# Patient Record
Sex: Male | Born: 1955
Health system: Southern US, Community
[De-identification: ages and names within clinical notes are randomized; demographics above are authoritative.]

## PROBLEM LIST (undated history)

## (undated) HISTORY — PX: ANKLE SURGERY: SHX546

---

## 2008-03-02 LAB — HM COLONOSCOPY: HM Colonoscopy: NORMAL

## 2011-08-01 ENCOUNTER — Emergency Department: Payer: Self-pay | Admitting: Internal Medicine

## 2014-11-02 LAB — LIPID PANEL
CHOLESTEROL: 213 mg/dL — AB (ref 0–200)
HDL: 49 mg/dL (ref 35–70)
LDL Cholesterol: 149 mg/dL
LDl/HDL Ratio: 3
Triglycerides: 74 mg/dL (ref 40–160)

## 2014-11-02 LAB — BASIC METABOLIC PANEL
BUN: 14 mg/dL (ref 4–21)
Creatinine: 1 mg/dL (ref 0.6–1.3)
GLUCOSE: 107 mg/dL
Potassium: 4.6 mmol/L (ref 3.4–5.3)
Sodium: 140 mmol/L (ref 137–147)

## 2014-11-02 LAB — CBC AND DIFFERENTIAL
HEMATOCRIT: 46 % (ref 41–53)
Hemoglobin: 16.4 g/dL (ref 13.5–17.5)
NEUTROS ABS: 2 /uL
Platelets: 226 10*3/uL (ref 150–399)
WBC: 4.7 10*3/mL

## 2014-11-02 LAB — HEPATIC FUNCTION PANEL
ALK PHOS: 66 U/L (ref 25–125)
ALT: 29 U/L (ref 10–40)
AST: 19 U/L (ref 14–40)
Bilirubin, Total: 0.7 mg/dL

## 2014-11-02 LAB — TSH: TSH: 2.78 u[IU]/mL (ref 0.41–5.90)

## 2014-11-30 ENCOUNTER — Emergency Department: Payer: Self-pay | Admitting: Emergency Medicine

## 2014-11-30 LAB — COMPREHENSIVE METABOLIC PANEL
ALBUMIN: 4.2 g/dL (ref 3.4–5.0)
ANION GAP: 4 — AB (ref 7–16)
Alkaline Phosphatase: 93 U/L
BUN: 13 mg/dL (ref 7–18)
Bilirubin,Total: 0.7 mg/dL (ref 0.2–1.0)
CALCIUM: 8.7 mg/dL (ref 8.5–10.1)
CHLORIDE: 103 mmol/L (ref 98–107)
CREATININE: 1.08 mg/dL (ref 0.60–1.30)
Co2: 31 mmol/L (ref 21–32)
EGFR (Non-African Amer.): >60
GLUCOSE: 118 mg/dL — AB (ref 65–99)
OSMOLALITY: 277 (ref 275–301)
Potassium: 4.3 mmol/L (ref 3.5–5.1)
SGOT(AST): 33 U/L (ref 15–37)
SGPT (ALT): 52 U/L
SODIUM: 138 mmol/L (ref 136–145)
TOTAL PROTEIN: 7.6 g/dL (ref 6.4–8.2)

## 2014-11-30 LAB — URINALYSIS, COMPLETE
BACTERIA: NONE SEEN
BLOOD: NEGATIVE
Bilirubin,UR: NEGATIVE
Glucose,UR: NEGATIVE mg/dL (ref 0–75)
Ketone: NEGATIVE
Leukocyte Esterase: NEGATIVE
Nitrite: NEGATIVE
Ph: 5 (ref 4.5–8.0)
Protein: NEGATIVE
RBC,UR: <1 /HPF (ref 0–5)
SPECIFIC GRAVITY: 1.016 (ref 1.003–1.030)
Squamous Epithelial: <1

## 2014-11-30 LAB — CBC WITH DIFFERENTIAL/PLATELET
BASOS PCT: 0.6 %
Basophil #: 0.1 10*3/uL (ref 0.0–0.1)
EOS ABS: 0.1 10*3/uL (ref 0.0–0.7)
EOS PCT: 1.2 %
HCT: 46.3 % (ref 40.0–52.0)
HGB: 15.8 g/dL (ref 13.0–18.0)
LYMPHS ABS: 1.3 10*3/uL (ref 1.0–3.6)
LYMPHS PCT: 12.9 %
MCH: 31.7 pg (ref 26.0–34.0)
MCHC: 34 g/dL (ref 32.0–36.0)
MCV: 93 fL (ref 80–100)
Monocyte #: 0.5 x10 3/mm (ref 0.2–1.0)
Monocyte %: 5.1 %
NEUTROS ABS: 7.9 10*3/uL — AB (ref 1.4–6.5)
Neutrophil %: 80.2 %
PLATELETS: 250 10*3/uL (ref 150–440)
RBC: 4.98 10*6/uL (ref 4.40–5.90)
RDW: 12.7 % (ref 11.5–14.5)
WBC: 9.8 10*3/uL (ref 3.8–10.6)

## 2014-11-30 LAB — LIPASE, BLOOD: Lipase: 128 U/L (ref 73–393)

## 2015-11-22 DIAGNOSIS — M1990 Unspecified osteoarthritis, unspecified site: Secondary | ICD-10-CM | POA: Insufficient documentation

## 2015-11-22 DIAGNOSIS — G44229 Chronic tension-type headache, not intractable: Secondary | ICD-10-CM | POA: Insufficient documentation

## 2015-11-22 DIAGNOSIS — K219 Gastro-esophageal reflux disease without esophagitis: Secondary | ICD-10-CM | POA: Insufficient documentation

## 2015-11-22 DIAGNOSIS — K8000 Calculus of gallbladder with acute cholecystitis without obstruction: Secondary | ICD-10-CM | POA: Insufficient documentation

## 2015-11-22 DIAGNOSIS — K8020 Calculus of gallbladder without cholecystitis without obstruction: Secondary | ICD-10-CM | POA: Insufficient documentation

## 2015-11-23 ENCOUNTER — Ambulatory Visit (INDEPENDENT_AMBULATORY_CARE_PROVIDER_SITE_OTHER): Payer: BLUE CROSS/BLUE SHIELD | Admitting: Family Medicine

## 2015-11-23 ENCOUNTER — Encounter: Payer: Self-pay | Admitting: Family Medicine

## 2015-11-23 VITALS — BP 110/72 | HR 58 | Temp 97.7°F | Resp 12 | Ht 69.75 in | Wt 198.0 lb

## 2015-11-23 DIAGNOSIS — Z125 Encounter for screening for malignant neoplasm of prostate: Secondary | ICD-10-CM | POA: Diagnosis not present

## 2015-11-23 DIAGNOSIS — Z1211 Encounter for screening for malignant neoplasm of colon: Secondary | ICD-10-CM | POA: Diagnosis not present

## 2015-11-23 DIAGNOSIS — Z23 Encounter for immunization: Secondary | ICD-10-CM | POA: Diagnosis not present

## 2015-11-23 DIAGNOSIS — Z0000 Encounter for general adult medical examination without abnormal findings: Secondary | ICD-10-CM

## 2015-11-23 LAB — POCT URINALYSIS DIPSTICK
BILIRUBIN UA: NEGATIVE
Blood, UA: NEGATIVE
Glucose, UA: NEGATIVE
KETONES UA: NEGATIVE
Leukocytes, UA: NEGATIVE
Nitrite, UA: NEGATIVE
PROTEIN UA: NEGATIVE
SPEC GRAV UA: 1.01
Urobilinogen, UA: NEGATIVE
pH, UA: 6

## 2015-11-23 LAB — IFOBT (OCCULT BLOOD): IFOBT: NEGATIVE

## 2015-11-23 NOTE — Progress Notes (Signed)
Patient ID: John Roy, male   DOB: 1956-04-15, 59 y.o.   MRN: [id number]  Visit Date: 11/23/2015  Today's Provider: [name], MD   Chief Complaint  Patient presents with  . Annual Exam   Subjective:  John Roy is a 59 y.o. male who presents today for health maintenance and complete physical. He feels well. 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 Immunization History  Administered Date(s) Administered  . Td 02/17/2004  . Tdap 03/22/2011  . Zoster 11/15/2014  Colonoscopy 03/02/08 normal.  Review of Systems  Constitutional: Negative.   Eyes: Negative.   Respiratory: Negative.   Cardiovascular: Negative.   Gastrointestinal: Negative.   Endocrine: Negative.   Genitourinary: Negative.   Musculoskeletal: Negative.   Skin: Negative.   Allergic/Immunologic: Negative.   Neurological: Positive for headaches.  Hematological: Negative.   Psychiatric/Behavioral: Negative.     Social History   Social History  . Marital Status: Married    Spouse Name: N/A  . Number of Children: N/A  . Years of Education: N/A   Occupational History  . Not on file.   Social History Main Topics  . Smoking status: Never Smoker   . Smokeless tobacco: Never Used  . Alcohol Use: Yes     Comment: ocassional/rare  . Drug Use: No  . Sexual Activity: Not on file   Other Topics Concern  . Not on file   Social History Narrative    Patient Active Problem List   Diagnosis Date Noted  . Cholelithiasis with acute cholecystitis without biliary obstruction 11/22/2015  . Calculus of gallbladder 11/22/2015  . Acid reflux 11/22/2015  . Chronic tension type headache 11/22/2015  . Osteoarthrosis 11/22/2015    Past Surgical History  Procedure Laterality Date  . Ankle surgery      His family history includes Diabetes in his maternal grandfather, sister, and sister; Heart disease in his mother; Kidney failure in his paternal grandfather; Lung cancer in his father.     Outpatient Prescriptions Prior to Visit  Medication Sig Dispense Refill  . ferrous sulfate (IRON SUPPLEMENT) 325 (65 FE) MG tablet Take by mouth.     No facility-administered medications prior to visit.    Patient Care Team: Jerrol Banana., MD as PCP - General (Family Medicine)     Objective:   Vitals:  Filed Vitals:   11/23/15 0942  BP: 110/72  Pulse: 58  Temp: 97.7 F (36.5 C)  Resp: 12  Height: 5' 9.75" (1.772 m)  Weight: 198 lb (89.812 kg)    Physical Exam  Constitutional: He is oriented to person, place, and time. He appears well-developed and well-nourished.  HENT:  Head: Normocephalic and atraumatic.  Right Ear: External ear normal.  Left Ear: External ear normal.  Nose: Nose normal.  Mouth/Throat: Oropharynx is clear and moist.  Eyes: Conjunctivae and EOM are normal. Pupils are equal, round, and reactive to light.  Neck: Neck supple.  Cardiovascular: Normal rate, regular rhythm, normal heart sounds and intact distal pulses.   Pulmonary/Chest: Effort normal and breath sounds normal.  Abdominal: Soft.  Genitourinary: Rectum normal, prostate normal and penis normal.  Musculoskeletal: Normal range of motion.  Neurological: He is alert and oriented to person, place, and time. He has normal reflexes. No cranial nerve deficit. He exhibits normal muscle tone. Coordination abnormal.  Skin: Skin is warm and dry.  Psychiatric: He has a normal mood and affect. His behavior is normal. Judgment and thought content normal.  Depression Screen PHQ 2/9 Scores 11/23/2015  PHQ - 2 Score 0    Health Maintenance  Normal CPE-/Zostavax at age 23. RTC 1 year.  I have done the exam and reviewed the above chart and it is accurate to the best of my knowledge.

## 2015-11-24 ENCOUNTER — Telehealth: Payer: Self-pay

## 2015-11-24 LAB — CBC WITH DIFFERENTIAL/PLATELET
BASOS ABS: 0 10*3/uL (ref 0.0–0.2)
BASOS: 1 %
EOS (ABSOLUTE): 0.2 10*3/uL (ref 0.0–0.4)
Eos: 4 %
Hematocrit: 46.3 % (ref 37.5–51.0)
Hemoglobin: 16.3 g/dL (ref 12.6–17.7)
IMMATURE GRANS (ABS): 0 10*3/uL (ref 0.0–0.1)
IMMATURE GRANULOCYTES: 0 %
LYMPHS: 32 %
Lymphocytes Absolute: 1.7 10*3/uL (ref 0.7–3.1)
MCH: 31.8 pg (ref 26.6–33.0)
MCHC: 35.2 g/dL (ref 31.5–35.7)
MCV: 90 fL (ref 79–97)
Monocytes Absolute: 0.5 10*3/uL (ref 0.1–0.9)
Monocytes: 10 %
NEUTROS PCT: 53 %
Neutrophils Absolute: 2.8 10*3/uL (ref 1.4–7.0)
PLATELETS: 235 10*3/uL (ref 150–379)
RBC: 5.12 x10E6/uL (ref 4.14–5.80)
RDW: 13.8 % (ref 12.3–15.4)
WBC: 5.2 10*3/uL (ref 3.4–10.8)

## 2015-11-24 LAB — TSH: TSH: 3.6 u[IU]/mL (ref 0.450–4.500)

## 2015-11-24 LAB — COMPREHENSIVE METABOLIC PANEL
ALT: 36 IU/L (ref 0–44)
AST: 19 IU/L (ref 0–40)
Albumin/Globulin Ratio: 1.9 (ref 1.1–2.5)
Albumin: 4.5 g/dL (ref 3.5–5.5)
Alkaline Phosphatase: 70 IU/L (ref 39–117)
BUN/Creatinine Ratio: 15 (ref 9–20)
BUN: 16 mg/dL (ref 6–24)
Bilirubin Total: 0.7 mg/dL (ref 0.0–1.2)
CALCIUM: 9.5 mg/dL (ref 8.7–10.2)
CO2: 25 mmol/L (ref 18–29)
CREATININE: 1.04 mg/dL (ref 0.76–1.27)
Chloride: 100 mmol/L (ref 96–106)
GFR, EST AFRICAN AMERICAN: 91 mL/min/{1.73_m2} (ref >59)
GFR, EST NON AFRICAN AMERICAN: 79 mL/min/{1.73_m2} (ref >59)
GLUCOSE: 111 mg/dL — AB (ref 65–99)
Globulin, Total: 2.4 g/dL (ref 1.5–4.5)
Potassium: 5 mmol/L (ref 3.5–5.2)
Sodium: 139 mmol/L (ref 134–144)
TOTAL PROTEIN: 6.9 g/dL (ref 6.0–8.5)

## 2015-11-24 LAB — LIPID PANEL WITH LDL/HDL RATIO
Cholesterol, Total: 215 mg/dL — ABNORMAL HIGH (ref 100–199)
HDL: 47 mg/dL (ref >39)
LDL Calculated: 151 mg/dL — ABNORMAL HIGH (ref 0–99)
LDL/HDL RATIO: 3.2 ratio (ref 0.0–3.6)
Triglycerides: 87 mg/dL (ref 0–149)
VLDL CHOLESTEROL CAL: 17 mg/dL (ref 5–40)

## 2015-11-24 LAB — PSA: PROSTATE SPECIFIC AG, SERUM: 1.2 ng/mL (ref 0.0–4.0)

## 2015-11-24 NOTE — Telephone Encounter (Signed)
Called no answer and unable to leave a message-aa

## 2015-11-24 NOTE — Telephone Encounter (Signed)
-----   Message from Jerrol Banana., MD sent at 11/24/2015  7:54 AM EST ----- Labs stable. Diet and exercise as discussed for prediabetes and moderately high lipids.

## 2015-11-28 NOTE — Telephone Encounter (Signed)
lmtcb-aa 

## 2015-11-28 NOTE — Telephone Encounter (Signed)
Pt advised-aa 

## 2016-05-13 IMAGING — CT CT ABD-PELV W/ CM
2 of 5 series · 16 of 46 positions shown, 18 images · IV contrast (omnipaque)
Comparison: Abdominal ultrasound performed 08/01/2011

CLINICAL DATA: Intermittent left lower quadrant abdominal pain for
3 years, with nausea and vomiting. Initial encounter.

EXAM:
CT ABDOMEN AND PELVIS WITH CONTRAST
TECHNIQUE: Multidetector CT imaging of the abdomen and pelvis was performed
using the standard protocol following bolus administration of
intravenous contrast.
CONTRAST:  100 mL of Omnipaque 300 IV contrast

[Series 2: routine abd pel with · axial · 0.78mm/px · z∈[-290,+134]mm · 13 of 96 slices shown, 15 images]
[im 6/96  soft-tissue]
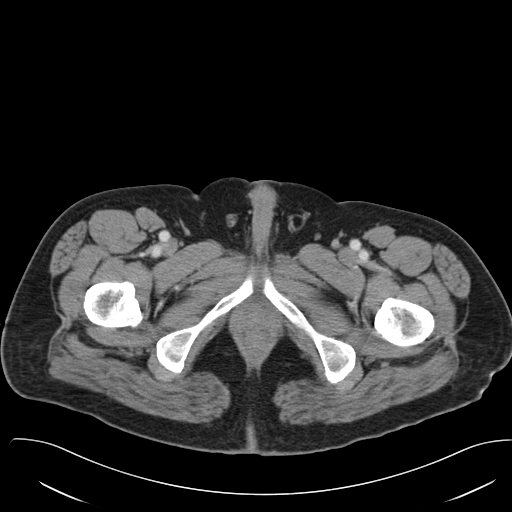
[im 6/96  bone]
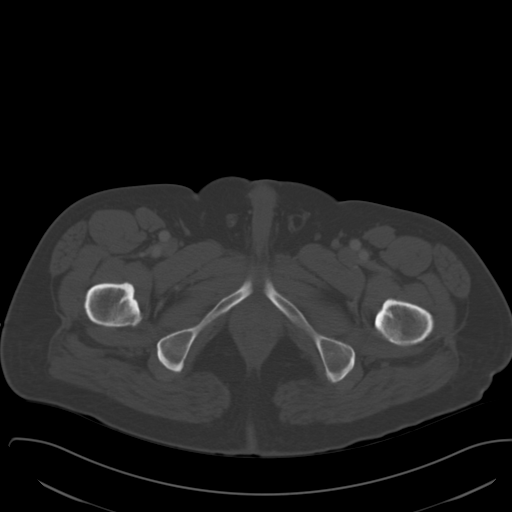
[im 16/96  soft-tissue]
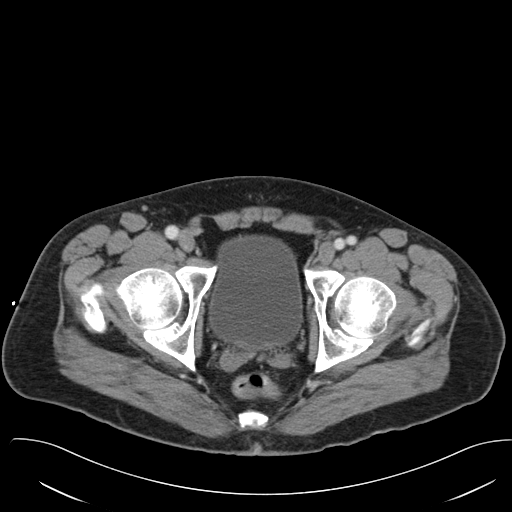
[im 21/96  soft-tissue]
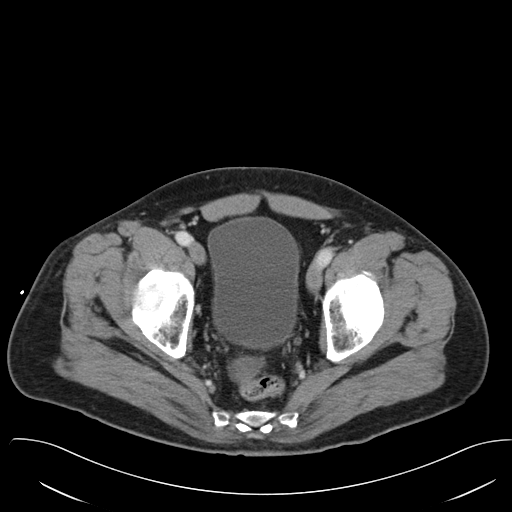
[im 26/96  soft-tissue]
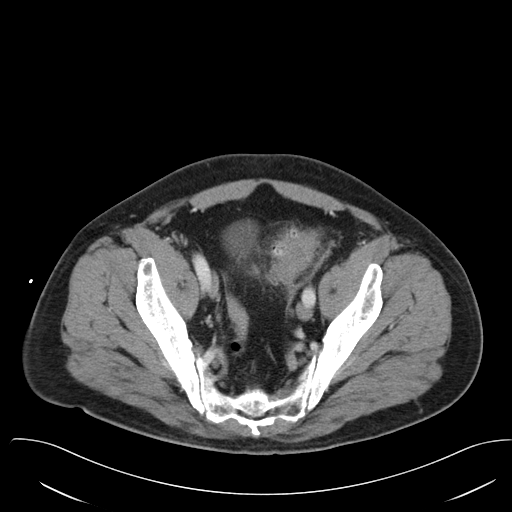
[im 36/96  soft-tissue]
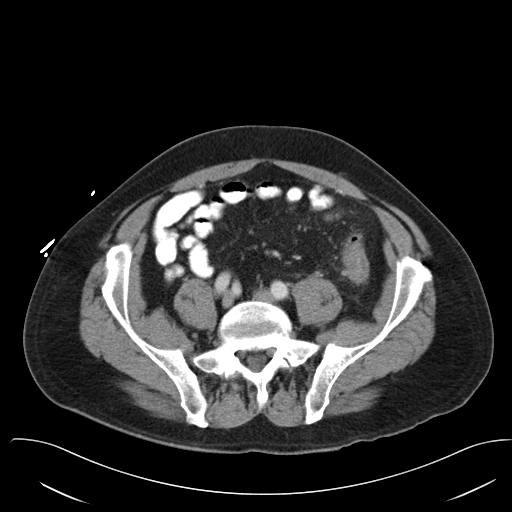
[im 41/96  soft-tissue]
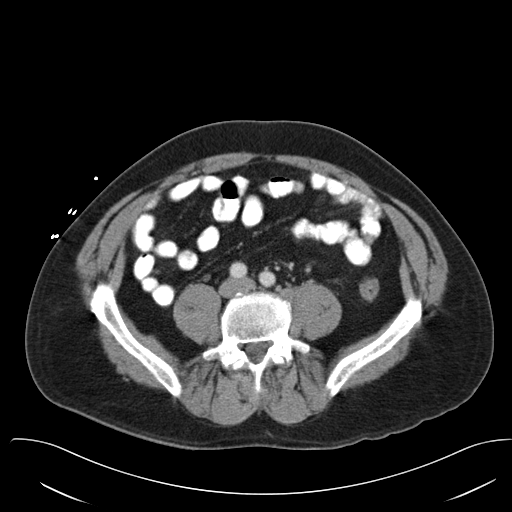
[im 51/96  soft-tissue]
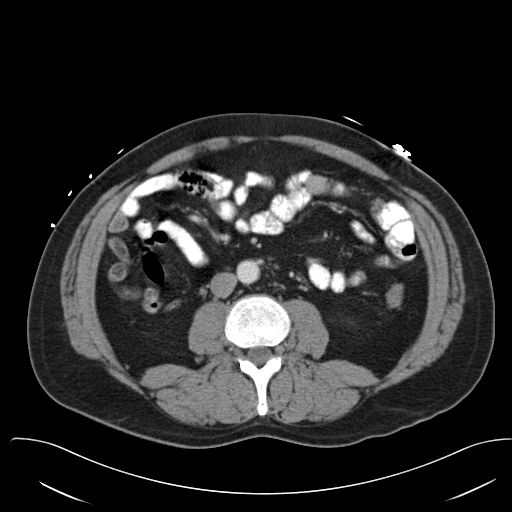
[im 56/96  soft-tissue]
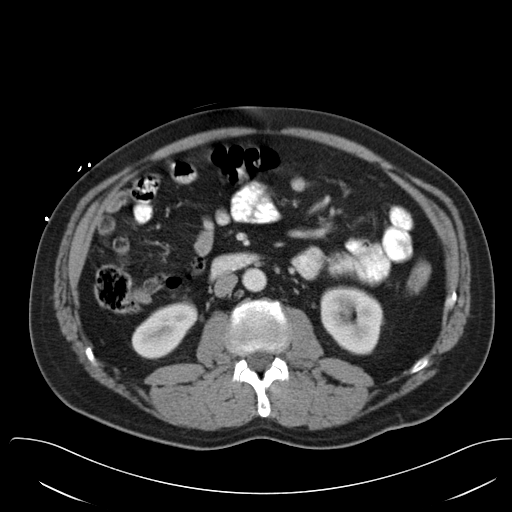
[im 61/96  soft-tissue]
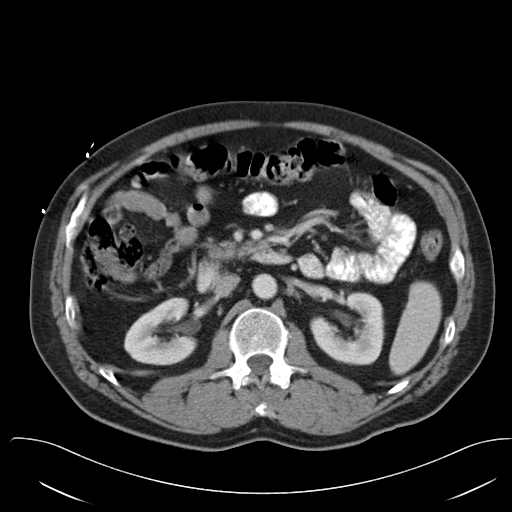
[im 61/96  bone]
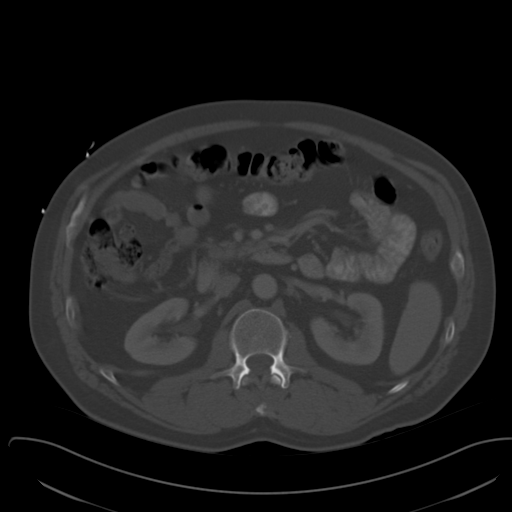
[im 71/96  soft-tissue]
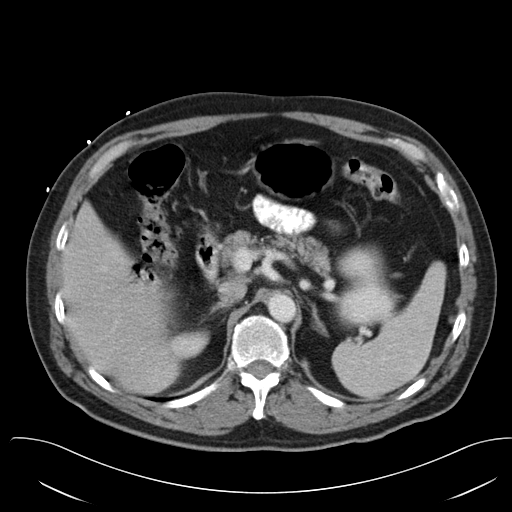
[im 76/96  soft-tissue]
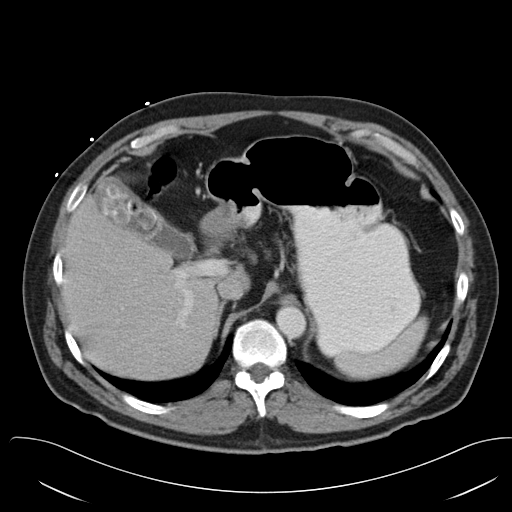
[im 81/96  soft-tissue]
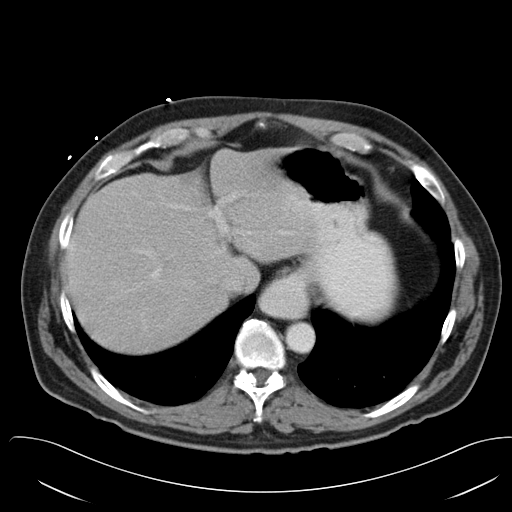
[im 91/96  soft-tissue]
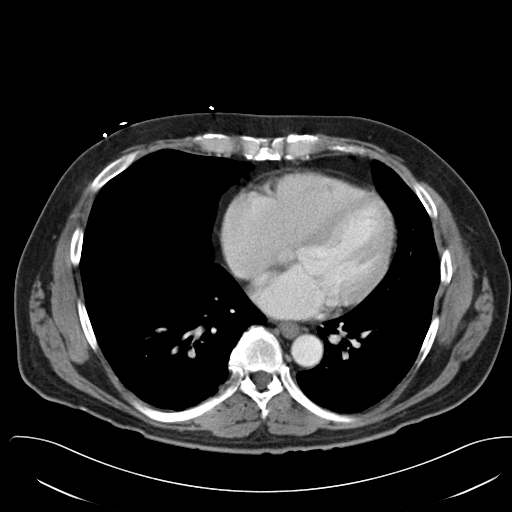

[Series 6: cor routine abd pel with · coronal · 0.81mm/px · 3 of 132 slices shown]
[im 44/132  soft-tissue]
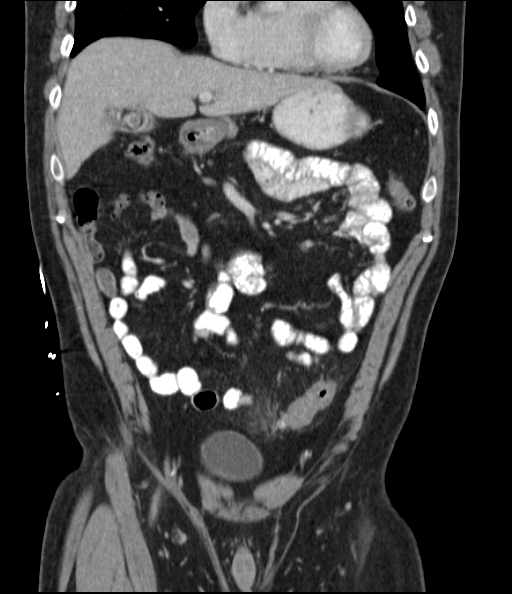
[im 59/132  soft-tissue]
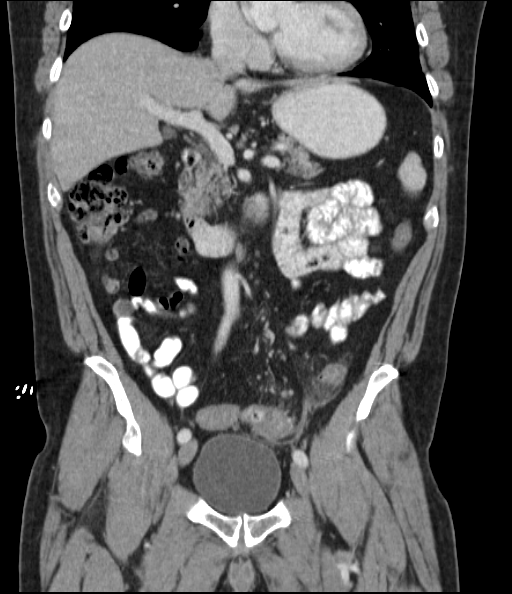
[im 73/132  soft-tissue]
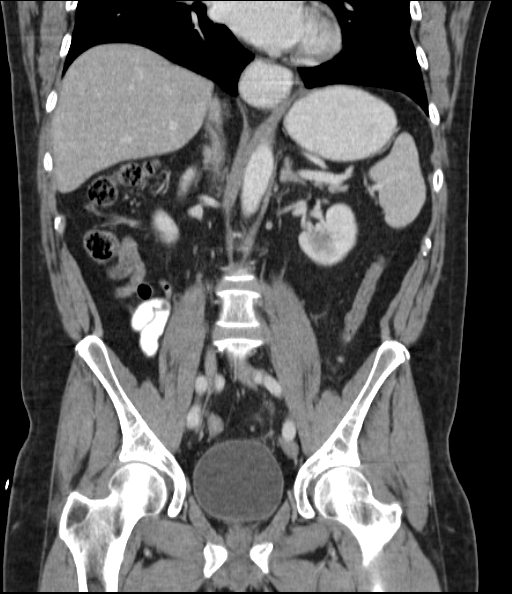

[16 of 46 positions shown; findings below may reference images not displayed]

FINDINGS: Minimal bibasilar atelectasis is noted. A small to moderate hiatal
hernia is seen, filled with contrast.

The liver and spleen are unremarkable in appearance. Stones are seen
filling the gallbladder. The pancreas and adrenal glands are
unremarkable.

The kidneys are unremarkable in appearance. There is no evidence of
hydronephrosis. No renal or ureteral stones are seen. No perinephric
stranding is appreciated. A tiny focus of increased attenuation at
the interpole region of the left kidney is thought to reflect
minimal contrast within a renal calyx.

The small bowel is unremarkable in appearance. The stomach is within
normal limits. No acute vascular abnormalities are seen.

The appendix is normal in caliber and contains air, but evidence for
appendicitis. Scattered diverticulosis is noted along the proximal
sigmoid colon. Focal associated soft tissue inflammation is seen,
with colonic wall thickening, compatible with acute diverticulitis.
Trace associated free fluid is noted, extending into the pelvis.
There is no evidence of perforation or abscess formation at this
time.

The bladder is mildly distended and grossly unremarkable. The
prostate remains normal in size, with minimal calcification. No
inguinal lymphadenopathy is seen.

No acute osseous abnormalities are identified.
IMPRESSION: 1. Acute diverticulitis noted, with focal soft tissue inflammation
and colonic wall thickening along the proximal sigmoid colon. Trace
associated free fluid seen. No evidence of perforation or abscess
formation at this time.
2. Cholelithiasis noted; gallbladder otherwise unremarkable.
3. Small to moderate hiatal hernia seen, filled with contrast.

## 2016-11-27 ENCOUNTER — Ambulatory Visit (INDEPENDENT_AMBULATORY_CARE_PROVIDER_SITE_OTHER): Payer: BLUE CROSS/BLUE SHIELD | Admitting: Family Medicine

## 2016-11-27 ENCOUNTER — Encounter: Payer: Self-pay | Admitting: Family Medicine

## 2016-11-27 VITALS — BP 112/76 | HR 62 | Temp 97.7°F | Resp 14 | Ht 69.75 in | Wt 188.0 lb

## 2016-11-27 DIAGNOSIS — Z719 Counseling, unspecified: Secondary | ICD-10-CM

## 2016-11-27 DIAGNOSIS — Z0000 Encounter for general adult medical examination without abnormal findings: Secondary | ICD-10-CM | POA: Diagnosis not present

## 2016-11-27 DIAGNOSIS — L570 Actinic keratosis: Secondary | ICD-10-CM | POA: Diagnosis not present

## 2016-11-27 DIAGNOSIS — Z23 Encounter for immunization: Secondary | ICD-10-CM | POA: Diagnosis not present

## 2016-11-27 DIAGNOSIS — R739 Hyperglycemia, unspecified: Secondary | ICD-10-CM | POA: Diagnosis not present

## 2016-11-27 DIAGNOSIS — Z1211 Encounter for screening for malignant neoplasm of colon: Secondary | ICD-10-CM

## 2016-11-27 DIAGNOSIS — Z125 Encounter for screening for malignant neoplasm of prostate: Secondary | ICD-10-CM

## 2016-11-27 LAB — POCT URINALYSIS DIPSTICK
Bilirubin, UA: NEGATIVE
GLUCOSE UA: NEGATIVE
Ketones, UA: NEGATIVE
Leukocytes, UA: NEGATIVE
NITRITE UA: NEGATIVE
PH UA: 6
RBC UA: NEGATIVE
Spec Grav, UA: 1.015
UROBILINOGEN UA: 0.2

## 2016-11-27 LAB — IFOBT (OCCULT BLOOD): IFOBT: NEGATIVE

## 2016-11-27 NOTE — Progress Notes (Signed)
Patient: John Roy, Male    DOB: [birth year], 60 y.o.   MRN: [id number] Visit Date: 11/27/2016  Today's Provider: [name], MD   Chief Complaint  Patient presents with  . Annual Exam   Subjective:  John Roy is a 60 y.o. male who presents today for health maintenance and complete physical. He feels well. He reports exercising goes to [employer] 3 times a week for about 45 minutes to 1 hour. He reports he is sleeping well. Patient is married., He has twins, a boy Jeneen Rinks and the daughter Lovena Le. Jeneen Rinks is in graduate  school for public health. Lovena Le is a fifth grade teacher in [place]. Jeneen Rinks is recently married and Lovena Le has avery serious boyfriend. No grandchildren to this point. He is bothered by extra tissue under his chin which looks heavy in pictures as he gets older.  No Pain but is interested in plastic surgery for this issue. Immunization History  Administered Date(s) Administered  . Influenza,inj,Quad PF,36+ Mos 11/23/2015  . Td 02/17/2004  . Tdap 03/22/2011  . Zoster 11/15/2014   Last colonoscopy 03/02/08 normal  Review of Systems  Constitutional: Negative.   HENT: Negative.   Eyes: Negative.   Respiratory: Negative.   Cardiovascular: Negative.   Gastrointestinal: Negative.   Endocrine: Negative.   Genitourinary: Negative.   Musculoskeletal: Negative.   Skin: Negative.   Allergic/Immunologic: Negative.   Neurological: Negative.   Hematological: Negative.   Psychiatric/Behavioral: Negative.     Social History   Social History  . Marital status: Married    Spouse name: N/A  . Number of children: N/A  . Years of education: N/A   Occupational History  . Not on file.   Social History Main Topics  . Smoking status: Never Smoker  . Smokeless tobacco: Never Used  . Alcohol use Yes     Comment: ocassional/rare  . Drug use: No  . Sexual activity: Not on file   Other Topics Concern  . Not on file   Social History Narrative  . No narrative  on file    Patient Active Problem List   Diagnosis Date Noted  . Cholelithiasis with acute cholecystitis without biliary obstruction 11/22/2015  . Calculus of gallbladder 11/22/2015  . Acid reflux 11/22/2015  . Chronic tension type headache 11/22/2015  . Osteoarthrosis 11/22/2015    Past Surgical History:  Procedure Laterality Date  . ANKLE SURGERY      His family history includes Diabetes in his maternal grandfather, sister, and sister; Heart disease in his mother; Kidney failure in his paternal grandfather; Lung cancer in his father.     Outpatient Encounter Prescriptions as of 11/27/2016  Medication Sig  . Aspirin-Acetaminophen-Caffeine (EXCEDRIN PO) Take by mouth.   No facility-administered encounter medications on file as of 11/27/2016.     Patient Care Team: Jerrol Banana., MD as PCP - General (Family Medicine)      Objective:   Vitals:  Vitals:   11/27/16 0921  BP: 112/76  Pulse: 62  Resp: 14  Temp: 97.7 F (36.5 C)  Weight: 188 lb (85.3 kg)  Height: 5' 9.75" (1.772 m)    Physical Exam  Constitutional: He is oriented to person, place, and time. He appears well-developed and well-nourished.  HENT:  Head: Normocephalic and atraumatic.  Right Ear: External ear normal.  Left Ear: External ear normal.  Mouth/Throat: Oropharynx is clear and moist.  Eyes: Conjunctivae are normal. Pupils are equal, round, and reactive to light.  Neck: Normal  range of motion. Neck supple.  Cardiovascular: Normal rate, regular rhythm, normal heart sounds and intact distal pulses.   No murmur heard. Pulmonary/Chest: Effort normal and breath sounds normal. No respiratory distress. He has no wheezes.  Abdominal: Soft. There is no tenderness. There is no rebound.  Genitourinary: Rectum normal, prostate normal and penis normal. Rectal exam shows guaiac negative stool. No penile tenderness.  Musculoskeletal: He exhibits edema (trace). He exhibits no tenderness.  Neurological:  He is alert and oriented to person, place, and time. No cranial nerve deficit. Coordination normal.  Skin: Skin is warm and dry.  Probable SKs on the side of the face.  Psychiatric: He has a normal mood and affect. His behavior is normal. Judgment and thought content normal.     Depression Screen PHQ 2/9 Scores 11/23/2015  PHQ - 2 Score 0   Assessment & Plan:   1. Annual physical exam - CBC w/Diff/Platelet - Comprehensive metabolic panel - Lipid Panel With LDL/HDL Ratio - TSH - POCT urinalysis dipstick  2. Colon cancer screening - IFOBT POC (occult bld, rslt in office)  3. Need for influenza vaccination - Flu Vaccine QUAD 36+ mos PF IM (Fluarix & Fluzone Quad PF)  4. Prostate cancer screening - PSA  5. Hyperglycemia - HgB A1c  6. Encounter for consultation Consultation for plastic surgery possibly. - Ambulatory referral to ENT  7. AK (actinic keratosis) - Ambulatory referral to Dermatology 8.Roy chin Refer to plastic surgery. HPI, Exam and A&P transcribed under direction and in the presence of Miguel Aschoff, MD. I have done the exam and reviewed the chart and it is accurate to the best of my John. Development worker, community has been used and  any errors in dictation or transcription are unintentional. Miguel Aschoff M.D. [facility]

## 2016-11-28 ENCOUNTER — Telehealth: Payer: Self-pay

## 2016-11-28 LAB — LIPID PANEL WITH LDL/HDL RATIO
CHOLESTEROL TOTAL: 190 mg/dL (ref 100–199)
HDL: 50 mg/dL (ref >39)
LDL CALC: 127 mg/dL — AB (ref 0–99)
LDl/HDL Ratio: 2.5 ratio units (ref 0.0–3.6)
TRIGLYCERIDES: 65 mg/dL (ref 0–149)
VLDL Cholesterol Cal: 13 mg/dL (ref 5–40)

## 2016-11-28 LAB — COMPREHENSIVE METABOLIC PANEL
ALK PHOS: 72 IU/L (ref 39–117)
ALT: 18 IU/L (ref 0–44)
AST: 20 IU/L (ref 0–40)
Albumin/Globulin Ratio: 1.7 (ref 1.2–2.2)
Albumin: 4.6 g/dL (ref 3.5–5.5)
BILIRUBIN TOTAL: 0.7 mg/dL (ref 0.0–1.2)
BUN / CREAT RATIO: 14 (ref 9–20)
BUN: 14 mg/dL (ref 6–24)
CHLORIDE: 100 mmol/L (ref 96–106)
CO2: 27 mmol/L (ref 18–29)
CREATININE: 1.02 mg/dL (ref 0.76–1.27)
Calcium: 9.7 mg/dL (ref 8.7–10.2)
GFR calc Af Amer: 93 mL/min/{1.73_m2} (ref >59)
GFR calc non Af Amer: 80 mL/min/{1.73_m2} (ref >59)
Globulin, Total: 2.7 g/dL (ref 1.5–4.5)
Glucose: 112 mg/dL — ABNORMAL HIGH (ref 65–99)
Potassium: 4.7 mmol/L (ref 3.5–5.2)
SODIUM: 140 mmol/L (ref 134–144)
Total Protein: 7.3 g/dL (ref 6.0–8.5)

## 2016-11-28 LAB — CBC WITH DIFFERENTIAL/PLATELET
BASOS ABS: 0 10*3/uL (ref 0.0–0.2)
Basos: 0 %
EOS (ABSOLUTE): 0.1 10*3/uL (ref 0.0–0.4)
EOS: 2 %
HEMATOCRIT: 46.9 % (ref 37.5–51.0)
Hemoglobin: 16.6 g/dL (ref 13.0–17.7)
Immature Grans (Abs): 0 10*3/uL (ref 0.0–0.1)
Immature Granulocytes: 0 %
LYMPHS ABS: 1.5 10*3/uL (ref 0.7–3.1)
Lymphs: 31 %
MCH: 31.2 pg (ref 26.6–33.0)
MCHC: 35.4 g/dL (ref 31.5–35.7)
MCV: 88 fL (ref 79–97)
MONOS ABS: 0.4 10*3/uL (ref 0.1–0.9)
Monocytes: 9 %
NEUTROS ABS: 2.7 10*3/uL (ref 1.4–7.0)
Neutrophils: 58 %
Platelets: 217 10*3/uL (ref 150–379)
RBC: 5.32 x10E6/uL (ref 4.14–5.80)
RDW: 13.9 % (ref 12.3–15.4)
WBC: 4.7 10*3/uL (ref 3.4–10.8)

## 2016-11-28 LAB — HEMOGLOBIN A1C
ESTIMATED AVERAGE GLUCOSE: 105 mg/dL
HEMOGLOBIN A1C: 5.3 % (ref 4.8–5.6)

## 2016-11-28 LAB — PSA: PROSTATE SPECIFIC AG, SERUM: 1.5 ng/mL (ref 0.0–4.0)

## 2016-11-28 LAB — TSH: TSH: 3.75 u[IU]/mL (ref 0.450–4.500)

## 2016-11-28 NOTE — Telephone Encounter (Signed)
Patient advised as below. Patient verbalizes understanding and is in agreement with treatment plan.  

## 2016-11-28 NOTE — Telephone Encounter (Signed)
-----   Message from Jerrol Banana., MD sent at 11/28/2016  8:28 AM EST ----- Labs stable. Still prediabetic but cholesterol much better. Continue to work on diet and exercise as patient has been doing

## 2016-11-29 ENCOUNTER — Telehealth: Payer: Self-pay | Admitting: Family Medicine

## 2016-11-29 NOTE — Telephone Encounter (Signed)
I need more information to make ENT referral.There is no diagnosis in referral or note

## 2016-11-29 NOTE — Telephone Encounter (Signed)
Please review below Sarah-aa

## 2016-11-29 NOTE — Telephone Encounter (Signed)
Correct--Dr Nadeen Landau at [facility].

## 2016-11-29 NOTE — Telephone Encounter (Signed)
Dr Rosanna Randy said for consultation for possible plastic surgery for the chin part correct?-aa

## 2018-01-15 ENCOUNTER — Encounter: Payer: BLUE CROSS/BLUE SHIELD | Admitting: Family Medicine

## 2018-01-28 ENCOUNTER — Ambulatory Visit (INDEPENDENT_AMBULATORY_CARE_PROVIDER_SITE_OTHER): Payer: BLUE CROSS/BLUE SHIELD | Admitting: Family Medicine

## 2018-01-28 ENCOUNTER — Encounter: Payer: Self-pay | Admitting: Family Medicine

## 2018-01-28 VITALS — BP 122/70 | HR 56 | Temp 97.9°F | Resp 16 | Ht 70.0 in | Wt 172.0 lb

## 2018-01-28 DIAGNOSIS — Z1211 Encounter for screening for malignant neoplasm of colon: Secondary | ICD-10-CM

## 2018-01-28 DIAGNOSIS — Z125 Encounter for screening for malignant neoplasm of prostate: Secondary | ICD-10-CM | POA: Diagnosis not present

## 2018-01-28 DIAGNOSIS — Z1159 Encounter for screening for other viral diseases: Secondary | ICD-10-CM | POA: Diagnosis not present

## 2018-01-28 DIAGNOSIS — Z0000 Encounter for general adult medical examination without abnormal findings: Secondary | ICD-10-CM | POA: Diagnosis not present

## 2018-01-28 LAB — POCT URINALYSIS DIPSTICK
Bilirubin, UA: NEGATIVE
Blood, UA: NEGATIVE
GLUCOSE UA: NEGATIVE
Ketones, UA: NEGATIVE
LEUKOCYTES UA: NEGATIVE
NITRITE UA: NEGATIVE
PROTEIN UA: NEGATIVE
Spec Grav, UA: 1.02 (ref 1.010–1.025)
Urobilinogen, UA: 0.2 E.U./dL
pH, UA: 6.5 (ref 5.0–8.0)

## 2018-01-28 NOTE — Progress Notes (Signed)
Patient: John Roy, Male    DOB: October 27, 1956, 62 y.o.   MRN: [id number] Visit Date: 01/28/2018  Today's Provider: [name], MD   Chief Complaint  Patient presents with  . Annual Exam   Subjective:    Annual physical exam John Roy is a 62 y.o. male who presents today for health maintenance and complete physical. He feels well. He reports exercising regulary. He reports he is sleeping well. Pt exercises several times per week--either spinning or running.  Colonoscopy- 03/02/2008. Normal. Tdap- 03/22/2011.      Review of Systems  Constitutional: Negative.   HENT: Negative.   Eyes: Negative.   Respiratory: Negative.   Cardiovascular: Negative.   Gastrointestinal: Positive for abdominal pain.       Intermittent abdominal discomfort which pt associates with GB disease or diverticulosis.Not associated with food. Pain not severe. No associated symptoms.  Endocrine: Negative.   Genitourinary: Negative.   Musculoskeletal: Negative.   Skin: Negative.   Allergic/Immunologic: Negative.   Neurological: Negative.   Hematological: Negative.   Psychiatric/Behavioral: Negative.     Social History      He  reports that  has never smoked. he has never used smokeless tobacco. He reports that he drinks alcohol. He reports that he does not use drugs.       Social History   Socioeconomic History  . Marital status: Married    Spouse name: None  . Number of children: None  . Years of education: None  . Highest education level: None  Social Needs  . Financial resource strain: None  . Food insecurity - worry: None  . Food insecurity - inability: None  . Transportation needs - medical: None  . Transportation needs - non-medical: None  Occupational History  . None  Tobacco Use  . Smoking status: Never Smoker  . Smokeless tobacco: Never Used  Substance and Sexual Activity  . Alcohol use: Yes    Comment: ocassional/rare  . Drug use: No  . Sexual activity:  None  Other Topics Concern  . None  Social History Narrative  . None    No past medical history on file.   Patient Active Problem List   Diagnosis Date Noted  . Cholelithiasis with acute cholecystitis without biliary obstruction 11/22/2015  . Calculus of gallbladder 11/22/2015  . Acid reflux 11/22/2015  . Chronic tension type headache 11/22/2015  . Osteoarthrosis 11/22/2015    Past Surgical History:  Procedure Laterality Date  . ANKLE SURGERY      Family History        Family Status  Relation Name Status  . Mother  Alive  . Father  Deceased at age 10  . Sister  Alive  . PGF  Deceased at age 32  . Sister  Alive  . MGF  (Not Specified)        His family history includes Diabetes in his maternal grandfather, sister, and sister; Heart disease in his mother; Kidney failure in his paternal grandfather; Lung cancer in his father.      No Known Allergies   Current Outpatient Medications:  .  Aspirin-Acetaminophen-Caffeine (EXCEDRIN PO), Take by mouth., Disp: , Rfl:    Patient Care Team: Jerrol Banana., MD as PCP - General (Family Medicine)      Objective:   Vitals: BP 122/70 (BP Location: Right Arm, Patient Position: Sitting, Cuff Size: Normal)   Pulse (!) 56   Temp 97.9 F (36.6 C)  Resp 16   Ht 5\' 10"  (1.778 m)   Wt 172 lb (78 kg)   BMI 24.68 kg/m    Vitals:   01/28/18 0906  BP: 122/70  Pulse: (!) 56  Resp: 16  Temp: 97.9 F (36.6 C)  Weight: 172 lb (78 kg)  Height: 5\' 10"  (1.778 m)     Physical Exam  Constitutional: He is oriented to person, place, and time. He appears well-developed and well-nourished.  HENT:  Head: Normocephalic and atraumatic.  Right Ear: External ear normal.  Left Ear: External ear normal.  Nose: Nose normal.  Mouth/Throat: Oropharynx is clear and moist.  Eyes: Conjunctivae are normal. No scleral icterus.  Neck: No thyromegaly present.  Cardiovascular: Normal rate, regular rhythm, normal heart sounds and intact  distal pulses.  Pulmonary/Chest: Effort normal and breath sounds normal.  Abdominal: Soft.  Genitourinary: Penis normal.  Musculoskeletal: Normal range of motion.  Neurological: He is alert and oriented to person, place, and time.  Skin: Skin is warm and dry.  Psychiatric: He has a normal mood and affect. His behavior is normal. Thought content normal.     Depression Screen PHQ 2/9 Scores 01/28/2018 11/23/2015  PHQ - 2 Score 0 0      Assessment & Plan:     Routine Health Maintenance and Physical Exam  Exercise Activities and Dietary recommendations Goals    None      Immunization History  Administered Date(s) Administered  . Influenza,inj,Quad PF,6+ Mos 11/23/2015, 11/27/2016  . Td 02/17/2004  . Tdap 03/22/2011  . Zoster 11/15/2014    Health Maintenance  Topic Date Due  . Hepatitis C Screening  11-13-1956  . HIV Screening  04/27/1972  . INFLUENZA VACCINE  03/10/2018 (Originally 07/10/2017)  . COLONOSCOPY  03/02/2018  . TETANUS/TDAP  03/21/2021    Time for f/u colonoscopy. Discussed health benefits of physical activity, and encouraged him to engage in regular exercise appropriate for his age and condition.  Intermittent abdominal pain Refer to GI.     I have done the exam and reviewed the above chart and it is accurate to the best of my knowledge. Development worker, community has been used in this note in any air is in the dictation or transcription are unintentional.  [name], MD  [place]

## 2018-01-29 LAB — CBC WITH DIFFERENTIAL/PLATELET
BASOS: 1 %
Basophils Absolute: 0 10*3/uL (ref 0.0–0.2)
EOS (ABSOLUTE): 0.1 10*3/uL (ref 0.0–0.4)
EOS: 2 %
HEMATOCRIT: 42.7 % (ref 37.5–51.0)
HEMOGLOBIN: 14.4 g/dL (ref 13.0–17.7)
Immature Grans (Abs): 0 10*3/uL (ref 0.0–0.1)
Immature Granulocytes: 0 %
LYMPHS ABS: 1.2 10*3/uL (ref 0.7–3.1)
Lymphs: 29 %
MCH: 31.4 pg (ref 26.6–33.0)
MCHC: 33.7 g/dL (ref 31.5–35.7)
MCV: 93 fL (ref 79–97)
MONOCYTES: 9 %
Monocytes Absolute: 0.4 10*3/uL (ref 0.1–0.9)
Neutrophils Absolute: 2.4 10*3/uL (ref 1.4–7.0)
Neutrophils: 59 %
Platelets: 185 10*3/uL (ref 150–379)
RBC: 4.58 x10E6/uL (ref 4.14–5.80)
RDW: 13.5 % (ref 12.3–15.4)
WBC: 4.1 10*3/uL (ref 3.4–10.8)

## 2018-01-29 LAB — COMPREHENSIVE METABOLIC PANEL
ALBUMIN: 4.1 g/dL (ref 3.6–4.8)
ALT: 25 IU/L (ref 0–44)
AST: 15 IU/L (ref 0–40)
Albumin/Globulin Ratio: 1.9 (ref 1.2–2.2)
Alkaline Phosphatase: 58 IU/L (ref 39–117)
BUN/Creatinine Ratio: 14 (ref 10–24)
BUN: 13 mg/dL (ref 8–27)
Bilirubin Total: 0.6 mg/dL (ref 0.0–1.2)
CO2: 25 mmol/L (ref 20–29)
CREATININE: 0.92 mg/dL (ref 0.76–1.27)
Calcium: 9.3 mg/dL (ref 8.6–10.2)
Chloride: 103 mmol/L (ref 96–106)
GFR calc Af Amer: 104 mL/min/{1.73_m2} (ref >59)
GFR, EST NON AFRICAN AMERICAN: 90 mL/min/{1.73_m2} (ref >59)
GLOBULIN, TOTAL: 2.2 g/dL (ref 1.5–4.5)
Glucose: 101 mg/dL — ABNORMAL HIGH (ref 65–99)
Potassium: 4.8 mmol/L (ref 3.5–5.2)
SODIUM: 139 mmol/L (ref 134–144)
Total Protein: 6.3 g/dL (ref 6.0–8.5)

## 2018-01-29 LAB — LIPID PANEL
CHOL/HDL RATIO: 3 ratio (ref 0.0–5.0)
Cholesterol, Total: 166 mg/dL (ref 100–199)
HDL: 55 mg/dL (ref >39)
LDL CALC: 100 mg/dL — AB (ref 0–99)
Triglycerides: 55 mg/dL (ref 0–149)
VLDL CHOLESTEROL CAL: 11 mg/dL (ref 5–40)

## 2018-01-29 LAB — PSA: PROSTATE SPECIFIC AG, SERUM: 1.1 ng/mL (ref 0.0–4.0)

## 2018-01-29 LAB — TSH: TSH: 3.24 u[IU]/mL (ref 0.450–4.500)

## 2018-01-29 LAB — HEPATITIS C ANTIBODY: Hep C Virus Ab: <0.1 s/co ratio (ref 0.0–0.9)

## 2018-01-31 ENCOUNTER — Telehealth: Payer: Self-pay

## 2018-01-31 NOTE — Telephone Encounter (Signed)
LMTCB  Thanks,  -Roslin Norwood 

## 2018-01-31 NOTE — Telephone Encounter (Signed)
-----   Message from Jerrol Banana., MD sent at 01/31/2018 12:45 PM EST ----- Labs ok

## 2018-02-04 NOTE — Telephone Encounter (Signed)
LMTCB ED 

## 2018-02-04 NOTE — Telephone Encounter (Signed)
Advised  ED 

## 2018-03-17 ENCOUNTER — Encounter: Payer: Self-pay | Admitting: Family Medicine

## 2018-03-20 ENCOUNTER — Encounter: Payer: Self-pay | Admitting: *Deleted

## 2018-03-27 DIAGNOSIS — Z1211 Encounter for screening for malignant neoplasm of colon: Secondary | ICD-10-CM | POA: Diagnosis not present

## 2018-03-27 DIAGNOSIS — D125 Benign neoplasm of sigmoid colon: Secondary | ICD-10-CM | POA: Diagnosis not present

## 2018-03-27 DIAGNOSIS — K5730 Diverticulosis of large intestine without perforation or abscess without bleeding: Secondary | ICD-10-CM | POA: Diagnosis not present

## 2018-03-27 DIAGNOSIS — K635 Polyp of colon: Secondary | ICD-10-CM | POA: Diagnosis not present

## 2018-11-22 ENCOUNTER — Ambulatory Visit: Payer: Self-pay | Admitting: Podiatry

## 2018-11-27 DIAGNOSIS — H11159 Pinguecula, unspecified eye: Secondary | ICD-10-CM | POA: Diagnosis not present

## 2018-12-12 ENCOUNTER — Ambulatory Visit: Payer: Self-pay | Admitting: Podiatry

## 2019-01-29 ENCOUNTER — Encounter: Payer: Self-pay | Admitting: Family Medicine

## 2019-03-12 ENCOUNTER — Encounter: Payer: Self-pay | Admitting: Family Medicine

## 2019-05-18 ENCOUNTER — Encounter: Payer: Self-pay | Admitting: Family Medicine

## 2019-08-03 ENCOUNTER — Other Ambulatory Visit: Payer: Self-pay

## 2019-08-03 ENCOUNTER — Ambulatory Visit (INDEPENDENT_AMBULATORY_CARE_PROVIDER_SITE_OTHER): Payer: BC Managed Care – PPO | Admitting: Family Medicine

## 2019-08-03 ENCOUNTER — Encounter: Payer: Self-pay | Admitting: Family Medicine

## 2019-08-03 VITALS — BP 122/70 | HR 53 | Temp 97.3°F | Resp 16 | Ht 70.0 in | Wt 179.0 lb

## 2019-08-03 DIAGNOSIS — Z0000 Encounter for general adult medical examination without abnormal findings: Secondary | ICD-10-CM | POA: Diagnosis not present

## 2019-08-03 DIAGNOSIS — Z125 Encounter for screening for malignant neoplasm of prostate: Secondary | ICD-10-CM | POA: Diagnosis not present

## 2019-08-03 NOTE — Progress Notes (Signed)
Patient: John Roy, Male    DOB: January 06, 1956, 63 y.o.   MRN: [id number] Visit Date: 08/03/2019  Today's Provider: [name], MD   Chief Complaint  Patient presents with  . Annual Exam   Subjective:     Annual physical exam John Roy is a 63 y.o. male who presents today for health maintenance and complete physical. He feels well. He reports exercising daily. He reports he is sleeping well.  Colonoscopy- 03/02/2008. Normal. Dr. Glennon Hamilton.  Tdap- 03/22/2011.   PSA-1.1  Review of Systems  Constitutional: Negative.   HENT: Negative.   Eyes: Negative.   Respiratory: Negative.   Cardiovascular: Negative.   Gastrointestinal: Negative.   Endocrine: Negative.   Genitourinary: Negative.   Musculoskeletal: Negative.   Skin: Negative.   Allergic/Immunologic: Negative.   Neurological: Negative.   Hematological: Negative.   Psychiatric/Behavioral: Negative.     Social History      He  reports that he has never smoked. He has never used smokeless tobacco. He reports current alcohol use. He reports that he does not use drugs.       Social History   Socioeconomic History  . Marital status: Married    Spouse name: Not on file  . Number of children: Not on file  . Years of education: Not on file  . Highest education level: Not on file  Occupational History  . Not on file  Social Needs  . Financial resource strain: Not on file  . Food insecurity    Worry: Not on file    Inability: Not on file  . Transportation needs    Medical: Not on file    Non-medical: Not on file  Tobacco Use  . Smoking status: Never Smoker  . Smokeless tobacco: Never Used  Substance and Sexual Activity  . Alcohol use: Yes    Comment: ocassional/rare  . Drug use: No  . Sexual activity: Not on file  Lifestyle  . Physical activity    Days per week: Not on file    Minutes per session: Not on file  . Stress: Not on file  Relationships  . Social Herbalist on phone: Not  on file    Gets together: Not on file    Attends religious service: Not on file    Active member of club or organization: Not on file    Attends meetings of clubs or organizations: Not on file    Relationship status: Not on file  Other Topics Concern  . Not on file  Social History Narrative  . Not on file    History reviewed. No pertinent past medical history.   Patient Active Problem List   Diagnosis Date Noted  . Cholelithiasis with acute cholecystitis without biliary obstruction 11/22/2015  . Calculus of gallbladder 11/22/2015  . Acid reflux 11/22/2015  . Chronic tension type headache 11/22/2015  . Osteoarthrosis 11/22/2015    Past Surgical History:  Procedure Laterality Date  . ANKLE SURGERY      Family History        Family Status  Relation Name Status  . Mother  Alive  . Father  Deceased at age 60  . Sister  Alive  . PGF  Deceased at age 42  . Sister  Alive  . MGF  (Not Specified)        His family history includes Diabetes in his maternal grandfather, sister, and sister; Heart disease in his mother; Kidney  failure in his paternal grandfather; Lung cancer in his father.      No Known Allergies   Current Outpatient Medications:  .  Aspirin-Acetaminophen-Caffeine (EXCEDRIN PO), Take by mouth., Disp: , Rfl:    Patient Care Team: Jerrol Banana., MD as PCP - General (Family Medicine)    Objective:    Vitals: BP 122/70 (BP Location: Left Arm, Patient Position: Sitting, Cuff Size: Normal)   Pulse (!) 53   Temp (!) 97.3 F (36.3 C) (Temporal)   Resp 16   Ht 5\' 10"  (1.778 m)   Wt 179 lb (81.2 kg)   SpO2 97%   BMI 25.68 kg/m    Vitals:   08/03/19 0851  BP: 122/70  Pulse: (!) 53  Resp: 16  Temp: (!) 97.3 F (36.3 C)  TempSrc: Temporal  SpO2: 97%  Weight: 179 lb (81.2 kg)  Height: 5\' 10"  (1.778 m)     Physical Exam Vitals signs reviewed.  Constitutional:      Appearance: He is well-developed.  HENT:     Head: Normocephalic and  atraumatic.     Right Ear: External ear normal.     Left Ear: External ear normal.     Nose: Nose normal.  Eyes:     General: No scleral icterus.    Conjunctiva/sclera: Conjunctivae normal.  Neck:     Thyroid: No thyromegaly.  Cardiovascular:     Rate and Rhythm: Normal rate and regular rhythm.     Heart sounds: Normal heart sounds.  Pulmonary:     Effort: Pulmonary effort is normal.     Breath sounds: Normal breath sounds.  Abdominal:     Palpations: Abdomen is soft.  Genitourinary:    Penis: Normal.   Musculoskeletal: Normal range of motion.  Skin:    General: Skin is warm and dry.  Neurological:     General: No focal deficit present.     Mental Status: He is alert and oriented to person, place, and time.  Psychiatric:        Mood and Affect: Mood normal.        Behavior: Behavior normal.        Thought Content: Thought content normal.        Judgment: Judgment normal.      Depression Screen PHQ 2/9 Scores 01/28/2018 11/23/2015  PHQ - 2 Score 0 0       Assessment & Plan:     Routine Health Maintenance and Physical Exam  Exercise Activities and Dietary recommendations Goals   None     Immunization History  Administered Date(s) Administered  . Influenza,inj,Quad PF,6+ Mos 11/23/2015, 11/27/2016  . Td 02/17/2004  . Tdap 03/22/2011  . Zoster 11/15/2014    Health Maintenance  Topic Date Due  . HIV Screening  04/27/1972  . COLONOSCOPY  03/02/2018  . INFLUENZA VACCINE  07/11/2019  . TETANUS/TDAP  03/21/2021  . Hepatitis C Screening  Completed     Discussed health benefits of physical activity, and encouraged him to engage in regular exercise appropriate for his age and condition. RTC 1 year. Colonoscopy in [place], MD  [facility]

## 2019-08-04 LAB — COMPREHENSIVE METABOLIC PANEL
ALT: 32 IU/L (ref 0–44)
AST: 22 IU/L (ref 0–40)
Albumin/Globulin Ratio: 2.2 (ref 1.2–2.2)
Albumin: 4.2 g/dL (ref 3.8–4.8)
Alkaline Phosphatase: 69 IU/L (ref 39–117)
BUN/Creatinine Ratio: 16 (ref 10–24)
BUN: 15 mg/dL (ref 8–27)
Bilirubin Total: 0.5 mg/dL (ref 0.0–1.2)
CO2: 24 mmol/L (ref 20–29)
Calcium: 9.1 mg/dL (ref 8.6–10.2)
Chloride: 102 mmol/L (ref 96–106)
Creatinine, Ser: 0.96 mg/dL (ref 0.76–1.27)
GFR calc Af Amer: 98 mL/min/{1.73_m2} (ref >59)
GFR calc non Af Amer: 84 mL/min/{1.73_m2} (ref >59)
Globulin, Total: 1.9 g/dL (ref 1.5–4.5)
Glucose: 105 mg/dL — ABNORMAL HIGH (ref 65–99)
Potassium: 4.4 mmol/L (ref 3.5–5.2)
Sodium: 139 mmol/L (ref 134–144)
Total Protein: 6.1 g/dL (ref 6.0–8.5)

## 2019-08-04 LAB — CBC WITH DIFFERENTIAL/PLATELET
Basophils Absolute: 0 10*3/uL (ref 0.0–0.2)
Basos: 1 %
EOS (ABSOLUTE): 0.1 10*3/uL (ref 0.0–0.4)
Eos: 3 %
Hematocrit: 39 % (ref 37.5–51.0)
Hemoglobin: 13.2 g/dL (ref 13.0–17.7)
Immature Grans (Abs): 0 10*3/uL (ref 0.0–0.1)
Immature Granulocytes: 0 %
Lymphocytes Absolute: 1.2 10*3/uL (ref 0.7–3.1)
Lymphs: 34 %
MCH: 30 pg (ref 26.6–33.0)
MCHC: 33.8 g/dL (ref 31.5–35.7)
MCV: 89 fL (ref 79–97)
Monocytes Absolute: 0.3 10*3/uL (ref 0.1–0.9)
Monocytes: 10 %
Neutrophils Absolute: 1.9 10*3/uL (ref 1.4–7.0)
Neutrophils: 52 %
Platelets: 231 10*3/uL (ref 150–450)
RBC: 4.4 x10E6/uL (ref 4.14–5.80)
RDW: 12.8 % (ref 11.6–15.4)
WBC: 3.6 10*3/uL (ref 3.4–10.8)

## 2019-08-04 LAB — LIPID PANEL WITH LDL/HDL RATIO
Cholesterol, Total: 162 mg/dL (ref 100–199)
HDL: 51 mg/dL (ref >39)
LDL Calculated: 100 mg/dL — ABNORMAL HIGH (ref 0–99)
LDl/HDL Ratio: 2 ratio (ref 0.0–3.6)
Triglycerides: 54 mg/dL (ref 0–149)
VLDL Cholesterol Cal: 11 mg/dL (ref 5–40)

## 2019-08-04 LAB — PSA: Prostate Specific Ag, Serum: 1.5 ng/mL (ref 0.0–4.0)

## 2019-08-04 LAB — TSH: TSH: 2.89 u[IU]/mL (ref 0.450–4.500)

## 2019-08-07 ENCOUNTER — Telehealth: Payer: Self-pay

## 2019-08-07 NOTE — Telephone Encounter (Signed)
Left message to call back  

## 2019-08-07 NOTE — Telephone Encounter (Signed)
Patient notified of lab results

## 2019-08-07 NOTE — Telephone Encounter (Signed)
-----   Message from Jerrol Banana., MD sent at 08/05/2019  3:58 PM EDT ----- Labs stable.

## 2019-08-12 ENCOUNTER — Other Ambulatory Visit: Payer: Self-pay

## 2019-08-12 DIAGNOSIS — Z20822 Contact with and (suspected) exposure to covid-19: Secondary | ICD-10-CM

## 2019-08-13 LAB — NOVEL CORONAVIRUS, NAA: SARS-CoV-2, NAA: NOT DETECTED

## 2019-08-31 ENCOUNTER — Other Ambulatory Visit: Payer: Self-pay

## 2019-08-31 DIAGNOSIS — R6889 Other general symptoms and signs: Secondary | ICD-10-CM | POA: Diagnosis not present

## 2019-08-31 DIAGNOSIS — Z20822 Contact with and (suspected) exposure to covid-19: Secondary | ICD-10-CM

## 2019-09-02 LAB — NOVEL CORONAVIRUS, NAA: SARS-CoV-2, NAA: NOT DETECTED

## 2020-08-02 NOTE — Progress Notes (Signed)
I,John Roy,acting as a scribe for John Durie, MD.,have documented all relevant documentation on the behalf of John Durie, MD,as directed by  John Durie, MD while in the presence of John Durie, MD.   Complete physical exam   Patient: John Roy   DOB: 12/21/1955   64 y.o. Male  MRN: [id number] Visit Date: 08/03/2020  Today's healthcare provider: [name], MD   Chief Complaint  Patient presents with  . Annual Exam   Subjective    John Roy is a 64 y.o. male who presents today for a complete physical exam.  He reports consuming a general diet. Home exercise routine includes walking. He generally feels fairly well. He reports sleeping fairly well. He does not have additional problems to discuss today.  Patient is married and is a father of the twins boy and girl.  His daughter just gave birth to twins. 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 HPI     History reviewed. No pertinent past medical history. Past Surgical History:  Procedure Laterality Date  . ANKLE SURGERY     Social History   Socioeconomic History  . Marital status: Married    Spouse name: Not on file  . Number of children: Not on file  . Years of education: Not on file  . Highest education level: Not on file  Occupational History  . Not on file  Tobacco Use  . Smoking status: Never Smoker  . Smokeless tobacco: Never Used  Substance and Sexual Activity  . Alcohol use: Yes    Comment: ocassional/rare  . Drug use: No  . Sexual activity: Not on file  Other Topics Concern  . Not on file  Social History Narrative  . Not on file   Social Determinants of Health   Financial Resource Strain:   . Difficulty of Paying Living Expenses: Not on file  Food Insecurity:   . Worried About Charity fundraiser in the Last Year: Not on file  . Ran Out of Food in the Last Year: Not on file  Transportation Needs:   . Lack  of Transportation (Medical): Not on file  . Lack of Transportation (Non-Medical): Not on file  Physical Activity:   . Days of Exercise per Week: Not on file  . Minutes of Exercise per Session: Not on file  Stress:   . Feeling of Stress : Not on file  Social Connections:   . Frequency of Communication with Friends and Family: Not on file  . Frequency of Social Gatherings with Friends and Family: Not on file  . Attends Religious Services: Not on file  . Active Member of Clubs or Organizations: Not on file  . Attends Archivist Meetings: Not on file  . Marital Status: Not on file  Intimate Partner Violence:   . Fear of Current or Ex-Partner: Not on file  . Emotionally Abused: Not on file  . Physically Abused: Not on file  . Sexually Abused: Not on file   Family Status  Relation Name Status  . Mother  Alive  . Father  Deceased at age 18  . Sister  Alive  . PGF  Deceased at age 76  . Sister  Alive  . MGF  (Not Specified)   Family History  Problem Relation Age of Onset  . Heart disease Mother   . Lung cancer Father   . Diabetes  Sister   . Kidney failure Paternal Grandfather   . Diabetes Sister   . Diabetes Maternal Grandfather    No Known Allergies  Patient Care Team: Jerrol Banana., MD as PCP - General (Family Medicine)   Medications: Outpatient Medications Prior to Visit  Medication Sig  . Aspirin-Acetaminophen-Caffeine (EXCEDRIN PO) Take by mouth.  . Ferrous Sulfate (IRON PO) daily.   . Zinc 50 MG CAPS once a week.    No facility-administered medications prior to visit.    Review of Systems  All other systems reviewed and are negative.     Objective    BP 124/77 (BP Location: Left Arm, Patient Position: Sitting, Cuff Size: Large)   Pulse (!) 56   Temp 98 F (36.7 C) (Oral)   Resp 16   Ht 5\' 10"  (1.778 m)   Wt 179 lb (81.2 kg)   SpO2 97%   BMI 25.68 kg/m    Physical Exam Vitals reviewed.  Constitutional:      Appearance: He is  well-developed.  HENT:     Head: Normocephalic and atraumatic.     Right Ear: External ear normal.     Left Ear: External ear normal.     Nose: Nose normal.  Eyes:     General: No scleral icterus.    Conjunctiva/sclera: Conjunctivae normal.  Neck:     Thyroid: No thyromegaly.  Cardiovascular:     Rate and Rhythm: Normal rate and regular rhythm.     Heart sounds: Normal heart sounds.  Pulmonary:     Effort: Pulmonary effort is normal.     Breath sounds: Normal breath sounds.  Abdominal:     Palpations: Abdomen is soft.  Genitourinary:    Penis: Normal.      Testes: Normal.     Prostate: Normal.     Rectum: Normal.  Skin:    General: Skin is warm and dry.     Comments: AKs.  Neurological:     General: No focal deficit present.     Mental Status: He is alert and oriented to person, place, and time.  Psychiatric:        Mood and Affect: Mood normal.        Behavior: Behavior normal.        Thought Content: Thought content normal.        Judgment: Judgment normal.      General Appearance:     Well developed, well nourished male. Alert, cooperative, in no acute distress, appears stated age  Head:    Normocephalic, without obvious abnormality, atraumatic  Eyes:    PERRL, conjunctiva/corneas clear, EOM's intact, fundi    benign, both eyes       Ears:    Normal TM's and external ear canals, both ears  Nose:   Nares normal, septum midline, mucosa normal, no drainage   or sinus tenderness  Throat:   Lips, mucosa, and tongue normal; teeth and gums normal  Neck:   Supple, symmetrical, trachea midline, no adenopathy;       thyroid:  No enlargement/tenderness/nodules; no carotid   bruit or JVD  Back:     Symmetric, no curvature, ROM normal, no CVA tenderness  Lungs:     Clear to auscultation bilaterally, respirations unlabored  Chest wall:    No tenderness or deformity  Heart:    Bradycardic. Normal rhythm. No murmurs, rubs, or gallops.  S1 and S2 normal  Abdomen:     Soft,  non-tender, bowel sounds active  all four quadrants,    no masses, no organomegaly  Genitalia:    normal, deferred  Rectal:    normal tone, normal prostate, no masses or tenderness  Extremities:   All extremities are intact. No cyanosis or edema  Pulses:   2+ and symmetric all extremities  Skin:   Skin color, texture, turgor normal, no rashes or lesions  Lymph nodes:   Cervical, supraclavicular, and axillary nodes normal  Neurologic:   CNII-XII intact. Normal strength, sensation and reflexes      throughout    Last depression screening scores PHQ 2/9 Scores 08/03/2020 01/28/2018 11/23/2015  PHQ - 2 Score 0 0 0  PHQ- 9 Score 1 - -   Last fall risk screening Fall Risk  01/28/2018  Falls in the past year? Yes  Number falls in past yr: 1  Comment fell off of his roof cleaning the gutters out about 4 months ago  Injury with Fall? Yes   Last Audit-C alcohol use screening Alcohol Use Disorder Test (AUDIT) 08/03/2020  1. How often do you have a drink containing alcohol? 2  2. How many drinks containing alcohol do you have on a typical day when you are drinking? 0  3. How often do you have six or more drinks on one occasion? 0  AUDIT-C Score 2  Alcohol Brief Interventions/Follow-up AUDIT Score <7 follow-up not indicated   A score of 3 or more in women, and 4 or more in men indicates increased risk for alcohol abuse, EXCEPT if all of the points are from question 1   Results for orders placed or performed in visit on 08/03/20  Urinalysis dipstick - POCT  Result Value Ref Range   Color, UA Yellow    Clarity, UA Clear    Glucose, UA Negative Negative   Bilirubin, UA Negative    Ketones, UA Negative    Spec Grav, UA 1.020 1.010 - 1.025   Blood, UA Negative    pH, UA 6.5 5.0 - 8.0   Protein, UA Negative Negative   Urobilinogen, UA 0.2 0.2 or 1.0 E.U./dL   Nitrite, UA Negative    Leukocytes, UA Negative Negative   Appearance Normal    Odor Normal   IFOBT POC (occult bld, rslt in office)   Result Value Ref Range   IFOBT Negative     Assessment & Plan    Routine Health Maintenance and Physical Exam  Exercise Activities and Dietary recommendations Goals   None     Immunization History  Administered Date(s) Administered  . Influenza,inj,Quad PF,6+ Mos 11/23/2015, 11/27/2016  . Td 02/17/2004  . Tdap 03/22/2011  . Zoster 11/15/2014    Health Maintenance  Topic Date Due  . COVID-19 Vaccine (1) Never done  . HIV Screening  Never done  . INFLUENZA VACCINE  07/10/2020  . TETANUS/TDAP  03/21/2021  . COLONOSCOPY  03/27/2028  . Hepatitis C Screening  Completed    Discussed health benefits of physical activity, and encouraged him to engage in regular exercise appropriate for his age and condition.  1. Annual physical exam Overall good health. - HIV Antibody (routine testing w rflx) - CBC - Comprehensive metabolic panel - Lipid panel - TSH - PSA - Urinalysis dipstick - POCT--Normal  2. Encounter for special screening examination for cardiovascular disorder  - Lipid panel  3. Encounter for screening for hematologic disorder  - CBC  4. Screening for hematuria or proteinuria  - Urinalysis dipstick - POCT--Normal  5. Encounter for screening for  HIV  - HIV Antibody (routine testing w rflx)  6. Screening for metabolic disorder  - Comprehensive metabolic panel  7. Prostate cancer screening  - PSA  8. Screening for thyroid disorder  - TSH  9. Actinic keratosis  - Ambulatory referral to Dermatology  10. Encounter for screening fecal occult blood testing  - IFOBT POC (occult bld, rslt in office)--Negative 11.  GERD Episodic.  May need GI referral.  Consider PPI but this seems to be diet and stress related.  Return in about 1 year (around 08/03/2021) for CPE.        Richmond Coldren Cranford Mon, MD  [facility] [phone] (phone) [phone] (fax)  [place]

## 2020-08-03 ENCOUNTER — Ambulatory Visit (INDEPENDENT_AMBULATORY_CARE_PROVIDER_SITE_OTHER): Payer: BLUE CROSS/BLUE SHIELD | Admitting: Family Medicine

## 2020-08-03 ENCOUNTER — Other Ambulatory Visit: Payer: Self-pay

## 2020-08-03 ENCOUNTER — Encounter: Payer: Self-pay | Admitting: Family Medicine

## 2020-08-03 VITALS — BP 124/77 | HR 56 | Temp 98.0°F | Resp 16 | Ht 70.0 in | Wt 179.0 lb

## 2020-08-03 DIAGNOSIS — Z13228 Encounter for screening for other metabolic disorders: Secondary | ICD-10-CM | POA: Diagnosis not present

## 2020-08-03 DIAGNOSIS — K219 Gastro-esophageal reflux disease without esophagitis: Secondary | ICD-10-CM

## 2020-08-03 DIAGNOSIS — Z136 Encounter for screening for cardiovascular disorders: Secondary | ICD-10-CM | POA: Diagnosis not present

## 2020-08-03 DIAGNOSIS — Z125 Encounter for screening for malignant neoplasm of prostate: Secondary | ICD-10-CM

## 2020-08-03 DIAGNOSIS — Z130 Encounter for screening for diseases of the blood and blood-forming organs and certain disorders involving the immune mechanism: Secondary | ICD-10-CM

## 2020-08-03 DIAGNOSIS — Z1329 Encounter for screening for other suspected endocrine disorder: Secondary | ICD-10-CM

## 2020-08-03 DIAGNOSIS — Z114 Encounter for screening for human immunodeficiency virus [HIV]: Secondary | ICD-10-CM | POA: Diagnosis not present

## 2020-08-03 DIAGNOSIS — Z0000 Encounter for general adult medical examination without abnormal findings: Secondary | ICD-10-CM | POA: Diagnosis not present

## 2020-08-03 DIAGNOSIS — L570 Actinic keratosis: Secondary | ICD-10-CM

## 2020-08-03 DIAGNOSIS — Z1211 Encounter for screening for malignant neoplasm of colon: Secondary | ICD-10-CM | POA: Diagnosis not present

## 2020-08-03 DIAGNOSIS — Z1389 Encounter for screening for other disorder: Secondary | ICD-10-CM | POA: Diagnosis not present

## 2020-08-03 LAB — POCT URINALYSIS DIPSTICK
Appearance: NORMAL
Bilirubin, UA: NEGATIVE
Blood, UA: NEGATIVE
Glucose, UA: NEGATIVE
Ketones, UA: NEGATIVE
Leukocytes, UA: NEGATIVE
Nitrite, UA: NEGATIVE
Odor: NORMAL
Protein, UA: NEGATIVE
Spec Grav, UA: 1.02 (ref 1.010–1.025)
Urobilinogen, UA: 0.2 E.U./dL
pH, UA: 6.5 (ref 5.0–8.0)

## 2020-08-03 LAB — IFOBT (OCCULT BLOOD): IFOBT: NEGATIVE

## 2020-08-04 ENCOUNTER — Telehealth: Payer: Self-pay

## 2020-08-04 LAB — LIPID PANEL
Chol/HDL Ratio: 3.6 ratio (ref 0.0–5.0)
Cholesterol, Total: 198 mg/dL (ref 100–199)
HDL: 55 mg/dL (ref >39)
LDL Chol Calc (NIH): 132 mg/dL — ABNORMAL HIGH (ref 0–99)
Triglycerides: 60 mg/dL (ref 0–149)
VLDL Cholesterol Cal: 11 mg/dL (ref 5–40)

## 2020-08-04 LAB — COMPREHENSIVE METABOLIC PANEL
ALT: 19 IU/L (ref 0–44)
AST: 18 IU/L (ref 0–40)
Albumin/Globulin Ratio: 1.9 (ref 1.2–2.2)
Albumin: 4.5 g/dL (ref 3.8–4.8)
Alkaline Phosphatase: 71 IU/L (ref 48–121)
BUN/Creatinine Ratio: 16 (ref 10–24)
BUN: 15 mg/dL (ref 8–27)
Bilirubin Total: 0.5 mg/dL (ref 0.0–1.2)
CO2: 24 mmol/L (ref 20–29)
Calcium: 9.2 mg/dL (ref 8.6–10.2)
Chloride: 103 mmol/L (ref 96–106)
Creatinine, Ser: 0.95 mg/dL (ref 0.76–1.27)
GFR calc Af Amer: 98 mL/min/{1.73_m2} (ref >59)
GFR calc non Af Amer: 85 mL/min/{1.73_m2} (ref >59)
Globulin, Total: 2.4 g/dL (ref 1.5–4.5)
Glucose: 110 mg/dL — ABNORMAL HIGH (ref 65–99)
Potassium: 4.7 mmol/L (ref 3.5–5.2)
Sodium: 139 mmol/L (ref 134–144)
Total Protein: 6.9 g/dL (ref 6.0–8.5)

## 2020-08-04 LAB — CBC
Hematocrit: 37.5 % (ref 37.5–51.0)
Hemoglobin: 11.5 g/dL — ABNORMAL LOW (ref 13.0–17.7)
MCH: 23.9 pg — ABNORMAL LOW (ref 26.6–33.0)
MCHC: 30.7 g/dL — ABNORMAL LOW (ref 31.5–35.7)
MCV: 78 fL — ABNORMAL LOW (ref 79–97)
Platelets: 238 10*3/uL (ref 150–450)
RBC: 4.82 x10E6/uL (ref 4.14–5.80)
RDW: 16.8 % — ABNORMAL HIGH (ref 11.6–15.4)
WBC: 4.1 10*3/uL (ref 3.4–10.8)

## 2020-08-04 LAB — PSA: Prostate Specific Ag, Serum: 2.1 ng/mL (ref 0.0–4.0)

## 2020-08-04 LAB — HIV ANTIBODY (ROUTINE TESTING W REFLEX): HIV Screen 4th Generation wRfx: NONREACTIVE

## 2020-08-04 LAB — TSH: TSH: 3.27 u[IU]/mL (ref 0.450–4.500)

## 2020-08-04 NOTE — Telephone Encounter (Signed)
-----   Message from Jerrol Banana., MD sent at 08/04/2020  1:03 PM EDT ----- Labs okay but blood count slightly down.  I like to see him back in a couple of months and get repeat CBC and iron panel.

## 2020-08-04 NOTE — Telephone Encounter (Signed)
Left message for patient to call back okay for [facility] triage to advise patient of lab results below. KW

## 2020-08-05 NOTE — Telephone Encounter (Signed)
Patient advised of lab results through mychart and has read the providers comments.  °

## 2020-11-28 ENCOUNTER — Other Ambulatory Visit: Payer: Self-pay

## 2020-11-28 ENCOUNTER — Ambulatory Visit: Payer: BLUE CROSS/BLUE SHIELD | Admitting: Dermatology

## 2020-11-28 DIAGNOSIS — D1800 Hemangioma unspecified site: Secondary | ICD-10-CM

## 2020-11-28 DIAGNOSIS — L853 Xerosis cutis: Secondary | ICD-10-CM

## 2020-11-28 DIAGNOSIS — L570 Actinic keratosis: Secondary | ICD-10-CM

## 2020-11-28 DIAGNOSIS — L821 Other seborrheic keratosis: Secondary | ICD-10-CM | POA: Diagnosis not present

## 2020-11-28 DIAGNOSIS — L578 Other skin changes due to chronic exposure to nonionizing radiation: Secondary | ICD-10-CM | POA: Diagnosis not present

## 2020-11-28 DIAGNOSIS — L820 Inflamed seborrheic keratosis: Secondary | ICD-10-CM | POA: Diagnosis not present

## 2020-11-28 NOTE — Progress Notes (Signed)
New Patient Visit  Subjective  John Roy is a 64 y.o. male who presents for the following: Check face and scalp  (For any new or changing skin lesions - patient has noticed lesions on the arms that he would like checked.).  The following portions of the chart were reviewed this encounter and updated as appropriate:   Tobacco  Allergies  Meds  Problems  Med Hx  Surg Hx  Fam Hx     Review of Systems:  No other skin or systemic complaints except as noted in HPI or Assessment and Plan.  Objective  Well appearing patient in no apparent distress; mood and affect are within normal limits.  A focused examination was performed including the face, scalp, arms and hands. Relevant physical exam findings are noted in the Assessment and Plan.  Objective  B/L arms x (5): Erythematous keratotic or waxy stuck-on papule or plaque.   Objective  Scalp and face x 16 (16): Erythematous thin papules/macules with gritty scale.   Assessment & Plan  Inflamed seborrheic keratosis (5) B/L arms x  Destruction of lesion - B/L arms x Complexity: simple   Destruction method: cryotherapy   Informed consent: discussed and consent obtained   Timeout:  patient name, date of birth, surgical site, and procedure verified Lesion destroyed using liquid nitrogen: Yes   Region frozen until ice ball extended beyond lesion: Yes   Outcome: patient tolerated procedure well with no complications   Post-procedure details: wound care instructions given    AK (actinic keratosis) (16) Scalp and face x 16  Destruction of lesion - Scalp and face x 16 Complexity: simple   Destruction method: cryotherapy   Informed consent: discussed and consent obtained   Timeout:  patient name, date of birth, surgical site, and procedure verified Lesion destroyed using liquid nitrogen: Yes   Region frozen until ice ball extended beyond lesion: Yes   Outcome: patient tolerated procedure well with no complications    Post-procedure details: wound care instructions given     Actinic Damage - Severe, chronic, secondary to cumulative UV radiation exposure over time - diffuse scaly erythematous macules and papules with underlying dyspigmentation - Discussed Prescription "Field Treatment" for Severe, Chronic Confluent Actinic Changes with Pre-Cancerous Actinic Keratoses Field treatment involves treatment of an entire area of skin that has confluent Actinic Changes (Sun/ Ultraviolet light damage) and PreCancerous Actinic Keratoses by method of PhotoDynamic Therapy (PDT) and/or prescription Topical Chemotherapy agents such as 5-fluorouracil, 5-fluorouracil/calcipotriene, and/or imiquimod.  The purpose is to decrease the number of clinically evident and subclinical PreCancerous lesions to prevent progression to development of skin cancer by chemically destroying early precancer changes that may or may not be visible.  It has been shown to reduce the risk of developing skin cancer in the treated area. As a result of treatment, redness, scaling, crusting, and open sores may occur during treatment course. One or more than one of these methods may be used and may have to be used several times to control, suppress and eliminate the PreCancerous changes. Discussed treatment course, expected reaction, and possible side effects. - Recommend daily broad spectrum sunscreen SPF 30+ to sun-exposed areas, reapply every 2 hours as needed.  - Call for new or changing lesions.  Seborrheic Keratoses - Stuck-on, waxy, tan-brown papules and plaques  - Discussed benign etiology and prognosis. - Observe - Call for any changes  Xerosis - diffuse xerotic patches - recommend gentle, hydrating skin care - gentle skin care handout given  Hemangiomas - Red papules - Discussed benign nature - Observe - Call for any changes  Return in about 4 months (around 03/29/2021) for AK recheck; in 4-8 weeks with nurse PDT of the scalp, 12 weeks  PDT face.  Luther Redo, CMA, am acting as scribe for Sarina Ser, MD .  Documentation: I have reviewed the above documentation for accuracy and completeness, and I agree with the above.  Sarina Ser, MD

## 2020-12-07 ENCOUNTER — Encounter: Payer: Self-pay | Admitting: Dermatology

## 2020-12-27 ENCOUNTER — Ambulatory Visit: Payer: BLUE CROSS/BLUE SHIELD

## 2021-03-28 ENCOUNTER — Ambulatory Visit: Payer: BLUE CROSS/BLUE SHIELD | Admitting: Dermatology

## 2021-03-28 ENCOUNTER — Other Ambulatory Visit: Payer: Self-pay

## 2021-03-28 DIAGNOSIS — L570 Actinic keratosis: Secondary | ICD-10-CM | POA: Diagnosis not present

## 2021-03-28 DIAGNOSIS — D692 Other nonthrombocytopenic purpura: Secondary | ICD-10-CM

## 2021-03-28 DIAGNOSIS — L578 Other skin changes due to chronic exposure to nonionizing radiation: Secondary | ICD-10-CM | POA: Diagnosis not present

## 2021-03-28 NOTE — Patient Instructions (Addendum)
If you have any questions or concerns for your doctor, please call our main line at [phone] and press option 4 to reach your doctor's medical assistant. If no one answers, please leave a voicemail as directed and we will return your call as soon as possible. Messages left after 4 pm will be answered the following business day.   You may also send us a message via MyChart. We typically respond to MyChart messages within 1-2 business days.  For prescription refills, please ask your pharmacy to contact our office. Our fax number is [phone].  If you have an urgent issue when the clinic is closed that cannot wait until the next business day, you can page your doctor at the number below.    Please note that while we do our best to be available for urgent issues outside of office hours, we are not available 24/7.   If you have an urgent issue and are unable to reach us, you may choose to seek medical care at your doctor's office, retail clinic, urgent care center, or emergency room.  If you have a medical emergency, please immediately call 911 or go to the emergency department.  Pager Numbers  - Dr. Kowalski: [phone]  - Dr. Moye: [phone]  - Dr. Stewart: [phone]  In the event of inclement weather, please call our main line at [phone] for an update on the status of any delays or closures.  Dermatology Medication Tips: Please keep the boxes that topical medications come in in order to help keep track of the instructions about where and how to use these. Pharmacies typically print the medication instructions only on the boxes and not directly on the medication tubes.   If your medication is too expensive, please contact our office at [phone] option 4 or send us a message through MyChart.   We are unable to tell what your co-pay for medications will be in advance as this is different depending on your insurance coverage. However, we may be able to find a substitute  medication at lower cost or fill out paperwork to get insurance to cover a needed medication.   If a prior authorization is required to get your medication covered by your insurance company, please allow us 1-2 business days to complete this process.  Drug prices often vary depending on where the prescription is filled and some pharmacies may offer cheaper prices.  The website www.goodrx.com contains coupons for medications through different pharmacies. The prices here do not account for what the cost may be with help from insurance (it may be cheaper with your insurance), but the website can give you the price if you did not use any insurance.  - You can print the associated coupon and take it with your prescription to the pharmacy.  - You may also stop by our office during regular business hours and pick up a GoodRx coupon card.  - If you need your prescription sent electronically to a different pharmacy, notify our office through [facility] MyChart or by phone at [phone] option 4.  Instructions for [facility] Medications  One or more of your medications was sent to the [facility] mail order compounding pharmacy. You will receive an email from them and can purchase the medicine through that link. It will then be mailed to your home at the address you confirmed. If for any reason you do not receive an email from them, please check your spam folder. If you still do not find the email,   please let us know. [facility] phone number is [phone].   

## 2021-03-28 NOTE — Progress Notes (Signed)
   Follow-Up Visit   Subjective  John Roy is a 65 y.o. male who presents for the following: Actinic Keratosis (4 month follow up of face and scalp treated with LN2 x 16).  The following portions of the chart were reviewed this encounter and updated as appropriate:   Tobacco  Allergies  Meds  Problems  Med Hx  Surg Hx  Fam Hx     Review of Systems:  No other skin or systemic complaints except as noted in HPI or Assessment and Plan.  Objective  Well appearing patient in no apparent distress; mood and affect are within normal limits.  A focused examination was performed including the face and scalp . Relevant physical exam findings are noted in the Assessment and Plan.  Objective  Face, scalp, ears: Erythematous thin papules/macules with gritty scale.   Assessment & Plan  AK (actinic keratosis) Face, scalp, ears  Start 5FU/Calcipotriene mix BID x 1 week to the scalp, forehead, temples, and ears.   Actinic Damage - Severe, confluent actinic changes with pre-cancerous actinic keratoses  - Severe, chronic, not at goal, secondary to cumulative UV radiation exposure over time - diffuse scaly erythematous macules and papules with underlying dyspigmentation - Discussed Prescription "Field Treatment" for Severe, Chronic Confluent Actinic Changes with Pre-Cancerous Actinic Keratoses Field treatment involves treatment of an entire area of skin that has confluent Actinic Changes (Sun/ Ultraviolet light damage) and PreCancerous Actinic Keratoses by method of PhotoDynamic Therapy (PDT) and/or prescription Topical Chemotherapy agents such as 5-fluorouracil, 5-fluorouracil/calcipotriene, and/or imiquimod.  The purpose is to decrease the number of clinically evident and subclinical PreCancerous lesions to prevent progression to development of skin cancer by chemically destroying early precancer changes that may or may not be visible.  It has been shown to reduce the risk of developing skin  cancer in the treated area. As a result of treatment, redness, scaling, crusting, and open sores may occur during treatment course. One or more than one of these methods may be used and may have to be used several times to control, suppress and eliminate the PreCancerous changes. Discussed treatment course, expected reaction, and possible side effects. - Recommend daily broad spectrum sunscreen SPF 30+ to sun-exposed areas, reapply every 2 hours as needed.  - Staying in the shade or wearing long sleeves, sun glasses (UVA+UVB protection) and wide brim hats (4-inch brim around the entire circumference of the hat) are also recommended. - Call for new or changing lesions.  Purpura - Chronic; persistent and recurrent.  Treatable, but not curable. - Violaceous macules and patches - Benign - Related to trauma, age, sun damage and/or use of blood thinners, chronic use of topical and/or oral steroids - Observe - Can use OTC arnica containing moisturizer such as Dermend Bruise Formula if desired - Call for worsening or other concerns  Return in about 4 months (around 07/28/2021) for AK follow up .  Luther Redo, CMA, am acting as scribe for Sarina Ser, MD .  Documentation: I have reviewed the above documentation for accuracy and completeness, and I agree with the above.  Sarina Ser, MD

## 2021-03-29 ENCOUNTER — Encounter: Payer: Self-pay | Admitting: Dermatology

## 2021-07-27 ENCOUNTER — Ambulatory Visit: Payer: BLUE CROSS/BLUE SHIELD | Admitting: Dermatology

## 2021-08-07 ENCOUNTER — Ambulatory Visit (INDEPENDENT_AMBULATORY_CARE_PROVIDER_SITE_OTHER): Payer: BLUE CROSS/BLUE SHIELD | Admitting: Family Medicine

## 2021-08-07 ENCOUNTER — Encounter: Payer: Self-pay | Admitting: Family Medicine

## 2021-08-07 ENCOUNTER — Other Ambulatory Visit: Payer: Self-pay

## 2021-08-07 VITALS — BP 131/78 | HR 52 | Temp 98.2°F | Resp 16 | Ht 70.5 in | Wt 184.0 lb

## 2021-08-07 DIAGNOSIS — Z136 Encounter for screening for cardiovascular disorders: Secondary | ICD-10-CM

## 2021-08-07 DIAGNOSIS — Z1329 Encounter for screening for other suspected endocrine disorder: Secondary | ICD-10-CM | POA: Diagnosis not present

## 2021-08-07 DIAGNOSIS — K219 Gastro-esophageal reflux disease without esophagitis: Secondary | ICD-10-CM

## 2021-08-07 DIAGNOSIS — Z130 Encounter for screening for diseases of the blood and blood-forming organs and certain disorders involving the immune mechanism: Secondary | ICD-10-CM

## 2021-08-07 DIAGNOSIS — Z13228 Encounter for screening for other metabolic disorders: Secondary | ICD-10-CM

## 2021-08-07 DIAGNOSIS — Z125 Encounter for screening for malignant neoplasm of prostate: Secondary | ICD-10-CM

## 2021-08-07 DIAGNOSIS — Z23 Encounter for immunization: Secondary | ICD-10-CM | POA: Diagnosis not present

## 2021-08-07 DIAGNOSIS — Z1389 Encounter for screening for other disorder: Secondary | ICD-10-CM

## 2021-08-07 DIAGNOSIS — Z0000 Encounter for general adult medical examination without abnormal findings: Secondary | ICD-10-CM

## 2021-08-07 NOTE — Progress Notes (Deleted)
      Established patient visit   Patient: John Roy   DOB: [birth year]   65 y.o. Male  MRN: [id number] Visit Date: 08/07/2021  Today's healthcare provider: [name], MD   No chief complaint on file.  Subjective  -------------------------------------------------------------------------------------------------------------------- HPI  ***  {Show patient history (optional):23778}   Medications: Outpatient Medications Prior to Visit  Medication Sig   Aspirin-Acetaminophen-Caffeine (EXCEDRIN PO) Take by mouth. (Patient not taking: Reported on 11/28/2020)   Ferrous Sulfate (IRON PO) daily.  (Patient not taking: Reported on 11/28/2020)   Zinc 50 MG CAPS once a week.  (Patient not taking: Reported on 11/28/2020)   No facility-administered medications prior to visit.    Review of Systems  All other systems reviewed and are negative.  {Labs  Heme  Chem  Endocrine  Serology  Results Review (optional):23779}   Objective  -------------------------------------------------------------------------------------------------------------------- There were no vitals taken for this visit. {Show previous vital signs (optional):23777}   Physical Exam  ***  No results found for any visits on 08/07/21.  Assessment & Plan  ---------------------------------------------------------------------------------------------------------------------- ***  No follow-ups on file.      {provider attestation***:1}   [name], MD  [facility] [phone] (phone) [phone] (fax)  [place]

## 2021-08-07 NOTE — Progress Notes (Signed)
Complete physical exam   Patient: John Roy   DOB: January 01, 1956   65 y.o. Male  MRN: [id number] Visit Date: 08/07/2021  Today's healthcare provider: [name], MD   Chief Complaint  Patient presents with   Annual Exam   Subjective    John Roy is a 65 y.o. male who presents today for a complete physical exam.  He reports consuming a general diet. The patient does not participate in regular exercise at present. He generally feels fairly well. He reports sleeping well. He does not have additional problems to discuss today.  He is under a lot of stress as he works 4 days a week.  Taking care of his mother and stepfather.  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 Is married and is a father of twins.  His daughter is giving him twin 60monthold grandchildren.  [place]. No past medical history on file. Past Surgical History:  Procedure Laterality Date   ANKLE SURGERY     Social History   Socioeconomic History   Marital status: Married    Spouse name: Not on file   Number of children: Not on file   Years of education: Not on file   Highest education level: Not on file  Occupational History   Not on file  Tobacco Use   Smoking status: Never   Smokeless tobacco: Never  Substance and Sexual Activity   Alcohol use: Yes    Comment: ocassional/rare   Drug use: No   Sexual activity: Not on file  Other Topics Concern   Not on file  Social History Narrative   Not on file   Social Determinants of Health   Financial Resource Strain: Not on file  Food Insecurity: Not on file  Transportation Needs: Not on file  Physical Activity: Not on file  Stress: Not on file  Social Connections: Not on file  Intimate Partner Violence: Not on file   Family Status  Relation Name Status   Mother  Alive   Father  Deceased at age 66  Sister  Alive   PGF  Deceased at age 636  Sister  Alive    MGF  (Not Specified)   Family History  Problem Relation Age of Onset   Heart disease Mother    Lung cancer Father    Diabetes Sister    Kidney failure Paternal Grandfather    Diabetes Sister    Diabetes Maternal Grandfather    No Known Allergies  Patient Care Team: GJerrol Banana, MD as PCP - General (Family Medicine)   Medications: Outpatient Medications Prior to Visit  Medication Sig   Aspirin-Acetaminophen-Caffeine (EXCEDRIN PO) Take by mouth. (Patient not taking: No sig reported)   Ferrous Sulfate (IRON PO) daily.  (Patient not taking: No sig reported)   Zinc 50 MG CAPS once a week.  (Patient not taking: No sig reported)   No facility-administered medications prior to visit.    Review of Systems  All other systems reviewed and are negative.     Objective    BP 131/78   Pulse (!) 52   Temp 98.2 F (36.8 C)   Resp 16   Ht 5' 10.5" (1.791 m)   Wt 184 lb (83.5 kg)   BMI 26.03 kg/m  BP Readings from Last 3 Encounters:  08/07/21 131/78  08/03/20 124/77  08/03/19 122/70  Wt Readings from Last 3 Encounters:  08/07/21 184 lb (83.5 kg)  08/03/20 179 lb (81.2 kg)  08/03/19 179 lb (81.2 kg)      Physical Exam Vitals reviewed.  Constitutional:      Appearance: He is well-developed.  HENT:     Head: Normocephalic and atraumatic.     Right Ear: External ear normal.     Left Ear: External ear normal.     Nose: Nose normal.  Eyes:     General: No scleral icterus.    Conjunctiva/sclera: Conjunctivae normal.  Neck:     Thyroid: No thyromegaly.  Cardiovascular:     Rate and Rhythm: Normal rate and regular rhythm.     Heart sounds: Normal heart sounds.  Pulmonary:     Effort: Pulmonary effort is normal.     Breath sounds: Normal breath sounds.  Abdominal:     Palpations: Abdomen is soft.  Genitourinary:    Penis: Normal.      Testes: Normal.  Skin:    General: Skin is warm and dry.     Comments: AKs.  Neurological:     General: No focal  deficit present.     Mental Status: He is alert and oriented to person, place, and time.  Psychiatric:        Mood and Affect: Mood normal.        Behavior: Behavior normal.        Thought Content: Thought content normal.        Judgment: Judgment normal.      Last depression screening scores PHQ 2/9 Scores 08/07/2021 08/03/2020 01/28/2018  PHQ - 2 Score 0 0 0  PHQ- 9 Score - 1 -   Last fall risk screening Fall Risk  08/07/2021  Falls in the past year? 0  Number falls in past yr: 0  Comment -  Injury with Fall? 0  Risk for fall due to : No Fall Risks  Follow up Falls evaluation completed   Last Audit-C alcohol use screening Alcohol Use Disorder Test (AUDIT) 08/07/2021  1. How often do you have a drink containing alcohol? 2  2. How many drinks containing alcohol do you have on a typical day when you are drinking? 0  3. How often do you have six or more drinks on one occasion? 0  AUDIT-C Score 2  Alcohol Brief Interventions/Follow-up -   A score of 3 or more in women, and 4 or more in men indicates increased risk for alcohol abuse, EXCEPT if all of the points are from question 1   No results found for any visits on 08/07/21.  Assessment & Plan    Routine Health Maintenance and Physical Exam  Exercise Activities and Dietary recommendations  Goals   None     Immunization History  Administered Date(s) Administered   Influenza,inj,Quad PF,6+ Mos 11/23/2015, 11/27/2016   Td 02/17/2004   Tdap 03/22/2011   Zoster, Live 11/15/2014    Health Maintenance  Topic Date Due   COVID-19 Vaccine (1) Never done   Pneumococcal Vaccine 60-45 Years old (1 - PCV) Never done   Zoster Vaccines- Shingrix (1 of 2) Never done   TETANUS/TDAP  03/21/2021   INFLUENZA VACCINE  07/10/2021   COLONOSCOPY (Pts 45-33yr Insurance coverage will need to be confirmed)  03/27/2028   Hepatitis C Screening  Completed   HIV Screening  Completed   HPV VACCINES  Aged Out    Discussed health benefits of  physical activity, and encouraged him  to engage in regular exercise appropriate for his age and condition.  1. Annual physical exam  - Lipid panel - TSH - CBC w/Diff/Platelet - Comprehensive Metabolic Panel (CMET)  2. Encounter for special screening examination for cardiovascular disorder  - Lipid panel - TSH - CBC w/Diff/Platelet - Comprehensive Metabolic Panel (CMET)  3. Screening for metabolic disorder  - Lipid panel - TSH - CBC w/Diff/Platelet - Comprehensive Metabolic Panel (CMET)  4. Screening for thyroid disorder  - Lipid panel - TSH - CBC w/Diff/Platelet - Comprehensive Metabolic Panel (CMET)  5. Gastroesophageal reflux disease without esophagitis  - Lipid panel - TSH - CBC w/Diff/Platelet - Comprehensive Metabolic Panel (CMET)  6. Encounter for screening for hematologic disorder  - Lipid panel - TSH - CBC w/Diff/Platelet - Comprehensive Metabolic Panel (CMET)  7. Prostate cancer screening  - PSA  8. Screening for hematuria or proteinuria  - POCT urinalysis dipstick  9. Need for Tdap vaccination  - Tdap vaccine greater than or equal to 7yo IM   No follow-ups on file.     I, [name], MD, have reviewed all documentation for this visit. The documentation on 08/11/21 for the exam, diagnosis, procedures, and orders are all accurate and complete.    Gissel Keilman Cranford Mon, MD  [facility] [phone] (phone) [phone] (fax)  [place]

## 2021-08-08 ENCOUNTER — Ambulatory Visit: Payer: BLUE CROSS/BLUE SHIELD | Admitting: Dermatology

## 2021-08-08 LAB — CBC WITH DIFFERENTIAL/PLATELET
Basophils Absolute: 0 10*3/uL (ref 0.0–0.2)
Basos: 1 %
EOS (ABSOLUTE): 0.1 10*3/uL (ref 0.0–0.4)
Eos: 3 %
Hematocrit: 36.4 % — ABNORMAL LOW (ref 37.5–51.0)
Hemoglobin: 11.5 g/dL — ABNORMAL LOW (ref 13.0–17.7)
Immature Grans (Abs): 0 10*3/uL (ref 0.0–0.1)
Immature Granulocytes: 0 %
Lymphocytes Absolute: 1.5 10*3/uL (ref 0.7–3.1)
Lymphs: 35 %
MCH: 24.6 pg — ABNORMAL LOW (ref 26.6–33.0)
MCHC: 31.6 g/dL (ref 31.5–35.7)
MCV: 78 fL — ABNORMAL LOW (ref 79–97)
Monocytes Absolute: 0.4 10*3/uL (ref 0.1–0.9)
Monocytes: 10 %
Neutrophils Absolute: 2.2 10*3/uL (ref 1.4–7.0)
Neutrophils: 51 %
Platelets: 255 10*3/uL (ref 150–450)
RBC: 4.68 x10E6/uL (ref 4.14–5.80)
RDW: 14.3 % (ref 11.6–15.4)
WBC: 4.3 10*3/uL (ref 3.4–10.8)

## 2021-08-08 LAB — COMPREHENSIVE METABOLIC PANEL
ALT: 45 IU/L — ABNORMAL HIGH (ref 0–44)
AST: 21 IU/L (ref 0–40)
Albumin/Globulin Ratio: 2.2 (ref 1.2–2.2)
Albumin: 4.6 g/dL (ref 3.8–4.8)
Alkaline Phosphatase: 87 IU/L (ref 44–121)
BUN/Creatinine Ratio: 12 (ref 10–24)
BUN: 12 mg/dL (ref 8–27)
Bilirubin Total: 0.5 mg/dL (ref 0.0–1.2)
CO2: 23 mmol/L (ref 20–29)
Calcium: 9.2 mg/dL (ref 8.6–10.2)
Chloride: 104 mmol/L (ref 96–106)
Creatinine, Ser: 0.99 mg/dL (ref 0.76–1.27)
Globulin, Total: 2.1 g/dL (ref 1.5–4.5)
Glucose: 102 mg/dL — ABNORMAL HIGH (ref 65–99)
Potassium: 4.7 mmol/L (ref 3.5–5.2)
Sodium: 139 mmol/L (ref 134–144)
Total Protein: 6.7 g/dL (ref 6.0–8.5)
eGFR: 85 mL/min/{1.73_m2} (ref >59)

## 2021-08-08 LAB — LIPID PANEL
Chol/HDL Ratio: 3.6 ratio (ref 0.0–5.0)
Cholesterol, Total: 186 mg/dL (ref 100–199)
HDL: 51 mg/dL (ref >39)
LDL Chol Calc (NIH): 122 mg/dL — ABNORMAL HIGH (ref 0–99)
Triglycerides: 69 mg/dL (ref 0–149)
VLDL Cholesterol Cal: 13 mg/dL (ref 5–40)

## 2021-08-08 LAB — PSA: Prostate Specific Ag, Serum: 1.4 ng/mL (ref 0.0–4.0)

## 2021-08-08 LAB — TSH: TSH: 2.54 u[IU]/mL (ref 0.450–4.500)

## 2021-08-31 ENCOUNTER — Ambulatory Visit: Payer: BLUE CROSS/BLUE SHIELD | Admitting: Dermatology

## 2021-11-01 ENCOUNTER — Ambulatory Visit: Payer: Self-pay | Admitting: *Deleted

## 2021-11-01 NOTE — Telephone Encounter (Signed)
Patient triaged for + COVID home test. Patient states he was exposed to grandchildren who tested + COVID. Patient states he has very mild symptoms: slight cough, low grade fever, fatigue. Patient advised per COVID protocol: treatment/isolation. 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 Call sent for PCP review.

## 2021-11-01 NOTE — Telephone Encounter (Signed)
Please advise 

## 2021-11-01 NOTE — Telephone Encounter (Signed)
Pts wife called in stating she and the pt tested positive for covid last night and wanted to speak with a nurse about what they can take. Please advise.  Reason for Disposition  [1] COVID-19 diagnosed by positive lab test (e.g., PCR, rapid self-test kit) AND [2] mild symptoms (e.g., cough, fever, others) AND [4] no complications or SOB  Answer Assessment - Initial Assessment Questions 1. COVID-19 DIAGNOSIS: "Who made your COVID-19 diagnosis?" "Was it confirmed by a positive lab test or self-test?" If not diagnosed by a doctor (or NP/PA), ask "Are there lots of cases (community spread) where you live?" Note: See public health department website, if unsure.     + home test yesterday 2. COVID-19 EXPOSURE: "Was there any known exposure to COVID before the symptoms began?" CDC Definition of close contact: within 6 feet (2 meters) for a total of 15 minutes or more over a 24-hour period.      Grandchildren tested + COVID 3. ONSET: "When did the COVID-19 symptoms start?"      Lethargic last night- low energy 4. WORST SYMPTOM: "What is your worst symptom?" (e.g., cough, fever, shortness of breath, muscle aches)     Sniffle, low grade temp  5. COUGH: "Do you have a cough?" If Yes, ask: "How bad is the cough?"       Sporadic cough- throat tickle  6. FEVER: "Do you have a fever?" If Yes, ask: "What is your temperature, how was it measured, and when did it start?"     99.4-low grade last night 7. RESPIRATORY STATUS: "Describe your breathing?" (e.g., shortness of breath, wheezing, unable to speak)      normal 8. BETTER-SAME-WORSE: "Are you getting better, staying the same or getting worse compared to yesterday?"  If getting worse, ask, "In what way?"     Same 9. HIGH RISK DISEASE: "Do you have any chronic medical problems?" (e.g., asthma, heart or lung disease, weak immune system, obesity, etc.)     Low risk 10. VACCINE: "Have you had the COVID-19 vaccine?" If Yes, ask: "Which one, how many shots, when did  you get it?"       Yes-pfizer  11. BOOSTER: "Have you received your COVID-19 booster?" If Yes, ask: "Which one and when did you get it?"       Yes- first booster 12. PREGNANCY: "Is there any chance you are pregnant?" "When was your last menstrual period?"       na 13. OTHER SYMPTOMS: "Do you have any other symptoms?"  (e.g., chills, fatigue, headache, loss of smell or taste, muscle pain, sore throat)       no 14. O2 SATURATION MONITOR:  "Do you use an oxygen saturation monitor (pulse oximeter) at home?" If Yes, ask "What is your reading (oxygen level) today?" "What is your usual oxygen saturation reading?" (e.g., 95%)       no  Protocols used: Coronavirus (COVID-19) Diagnosed or Suspected-A-AH

## 2021-12-18 ENCOUNTER — Other Ambulatory Visit: Payer: Self-pay

## 2021-12-18 ENCOUNTER — Encounter: Payer: Self-pay | Admitting: Dermatology

## 2021-12-18 ENCOUNTER — Ambulatory Visit: Payer: BC Managed Care – PPO | Admitting: Dermatology

## 2021-12-18 DIAGNOSIS — L814 Other melanin hyperpigmentation: Secondary | ICD-10-CM | POA: Diagnosis not present

## 2021-12-18 DIAGNOSIS — L570 Actinic keratosis: Secondary | ICD-10-CM

## 2021-12-18 DIAGNOSIS — L578 Other skin changes due to chronic exposure to nonionizing radiation: Secondary | ICD-10-CM | POA: Diagnosis not present

## 2021-12-18 DIAGNOSIS — L821 Other seborrheic keratosis: Secondary | ICD-10-CM

## 2021-12-18 NOTE — Patient Instructions (Signed)
Cryotherapy Aftercare  Wash gently with soap and water everyday.   Apply Vaseline and Band-Aid daily until healed.   Prior to procedure, discussed risks of blister formation, small wound, skin dyspigmentation, or rare scar following cryotherapy. Recommend Vaseline ointment to treated areas while healing.    If You Need Anything After Your Visit  If you have any questions or concerns for your doctor, please call our main line at [phone] and press option 4 to reach your doctor's medical assistant. If no one answers, please leave a voicemail as directed and we will return your call as soon as possible. Messages left after 4 pm will be answered the following business day.   You may also send [place] a message via [place]. We typically respond to MyChart messages within 1-2 business days.  For prescription refills, please ask your pharmacy to contact our office. Our fax number is [phone].  If you have an urgent issue when the clinic is closed that cannot wait until the next business day, you can page your doctor at the number below.    Please note that while we do our best to be available for urgent issues outside of office hours, we are not available 24/7.   If you have an urgent issue and are unable to reach [place], you may choose to seek medical care at your doctor's office, retail clinic, urgent care center, or emergency room.  If you have a medical emergency, please immediately call 911 or go to the emergency department.  Pager Numbers  - Dr. Nehemiah Massed: [phone]  - Dr. Laurence Ferrari: [phone]  - Dr. Nicole Kindred: [phone]  In the event of inclement weather, please call our main line at [phone] for an update on the status of any delays or closures.  Dermatology Medication Tips: Please keep the boxes that topical medications come in in order to help keep track of the instructions about where and how to use these. Pharmacies typically print the medication instructions only on the  boxes and not directly on the medication tubes.   If your medication is too expensive, please contact our office at [phone] option 4 or send [place] a message through [place].   We are unable to tell what your co-pay for medications will be in advance as this is different depending on your insurance coverage. However, we may be able to find a substitute medication at lower cost or fill out paperwork to get insurance to cover a needed medication.   If a prior authorization is required to get your medication covered by your insurance company, please allow [place] 1-2 business days to complete this process.  Drug prices often vary depending on where the prescription is filled and some pharmacies may offer cheaper prices.  The website www.goodrx.com contains coupons for medications through different pharmacies. The prices here do not account for what the cost may be with help from insurance (it may be cheaper with your insurance), but the website can give you the price if you did not use any insurance.  - You can print the associated coupon and take it with your prescription to the pharmacy.  - You may also stop by our office during regular business hours and pick up a GoodRx coupon card.  - If you need your prescription sent electronically to a different pharmacy, notify our office through [facility] or by phone at [phone] option 4.     Si Usted Necesita Algo Despus de Su Visita  Tambin puede enviarnos un mensaje a  travs de MyChart. Por lo general respondemos a los mensajes de MyChart en el transcurso de 1 a 2 das hbiles.  Para renovar recetas, por favor pida a su farmacia que se ponga en contacto con nuestra oficina. Harland Dingwall de fax es [place] [phone].  Si tiene un asunto urgente cuando la clnica est cerrada y que no puede esperar hasta el siguiente da hbil, puede llamar/localizar a su doctor(a) al nmero que aparece a continuacin.   Por favor, tenga en cuenta que  aunque hacemos todo lo posible para estar disponibles para asuntos urgentes fuera del horario de [place], no estamos disponibles las 24 horas del da, los 7 das de la [place].   Si tiene un problema urgente y no puede comunicarse con nosotros, puede optar por buscar atencin mdica  en el consultorio de su doctor(a), en una clnica privada, en un centro de atencin urgente o en una sala de emergencias.  Si tiene Engineering geologist, por favor llame inmediatamente al 911 o vaya a la sala de emergencias.  Nmeros de bper  - Dr. Nehemiah Massed: [phone]  - Dra. Moye: [phone]  - Dra. Nicole Kindred: [phone]  En caso de inclemencias del [place], por favor llame a Johnsie Kindred principal al [phone] para una actualizacin sobre el [place] de cualquier retraso o cierre.  Consejos para la medicacin en dermatologa: Por favor, guarde las cajas en las que vienen los medicamentos de uso tpico para ayudarle a seguir las instrucciones sobre dnde y cmo usarlos. Las farmacias generalmente imprimen las instrucciones del medicamento slo en las cajas y no directamente en los tubos del [place].   Si su medicamento es muy caro, por favor, pngase en contacto con Zigmund Daniel llamando al [phone] y presione la opcin 4 o envenos un mensaje a travs de Pharmacist, community.   No podemos decirle cul ser su copago por los medicamentos por adelantado ya que esto es diferente dependiendo de la cobertura de su seguro. Sin embargo, es posible que podamos encontrar un medicamento sustituto a Electrical engineer un formulario para que el seguro cubra el medicamento que se considera necesario.   Si se requiere una autorizacin previa para que su compaa de seguros [place] su medicamento, por favor permtanos de 1 a 2 das hbiles para completar este proceso.  Los precios de los medicamentos varan con frecuencia dependiendo del Environmental consultant de dnde se surte la receta y alguna farmacias pueden ofrecer precios ms  baratos.  El sitio web www.goodrx.com tiene cupones para medicamentos de Airline pilot. Los precios aqu no tienen en cuenta lo que podra costar con la ayuda del seguro (puede ser ms barato con su seguro), pero el sitio web puede darle el precio si no utiliz Research scientist (physical sciences).  - Puede imprimir el cupn correspondiente y llevarlo con su receta a la farmacia.  - Tambin puede pasar por nuestra oficina durante el horario de atencin regular y Charity fundraiser una tarjeta de cupones de GoodRx.  - Si necesita que su receta se enve electrnicamente a una farmacia diferente, informe a nuestra oficina a travs de MyChart de  o por telfono llamando al [phone] y presione la opcin 4.

## 2021-12-18 NOTE — Progress Notes (Signed)
° °  Follow-Up Visit   Subjective  John Roy is a 66 y.o. male who presents for the following: Follow-up (Recheck AK's. Hx of 5FU/Calcipotriene BID x 1 week to the scalp, forehead, temples, and ears. Patient states he did this treatment twice. Once in late spring and once in late summer. Reports redness and irritation during treatment. ).  The following portions of the chart were reviewed this encounter and updated as appropriate:  Tobacco   Allergies   Meds   Problems   Med Hx   Surg Hx   Fam Hx      Review of Systems: No other skin or systemic complaints except as noted in HPI or Assessment and Plan.  Objective  Well appearing patient in no apparent distress; mood and affect are within normal limits.  A focused examination was performed including scalp, face, neck, hands. Relevant physical exam findings are noted in the Assessment and Plan.  left scalp x1 Erythematous thin papules/macules with gritty scale.    Assessment & Plan  AK (actinic keratosis) left scalp x1  Actinic keratoses are precancerous spots that appear secondary to cumulative UV radiation exposure/sun exposure over time. They are chronic with expected duration over 1 year. A portion of actinic keratoses will progress to squamous cell carcinoma of the skin. It is not possible to reliably predict which spots will progress to skin cancer and so treatment is recommended to prevent development of skin cancer.  Recommend daily broad spectrum sunscreen SPF 30+ to sun-exposed areas, reapply every 2 hours as needed.  Recommend staying in the shade or wearing long sleeves, sun glasses (UVA+UVB protection) and wide brim hats (4-inch brim around the entire circumference of the hat). Call for new or changing lesions.  Destruction of lesion - left scalp x1 Complexity: simple   Destruction method: cryotherapy   Informed consent: discussed and consent obtained   Timeout:  patient name, date of birth, surgical site, and procedure  verified Lesion destroyed using liquid nitrogen: Yes   Region frozen until ice ball extended beyond lesion: Yes   Outcome: patient tolerated procedure well with no complications   Post-procedure details: wound care instructions given    Actinic Damage - chronic, secondary to cumulative UV radiation exposure/sun exposure over time - diffuse scaly erythematous macules with underlying dyspigmentation - Recommend daily broad spectrum sunscreen SPF 30+ to sun-exposed areas, reapply every 2 hours as needed.  - Recommend staying in the shade or wearing long sleeves, sun glasses (UVA+UVB protection) and wide brim hats (4-inch brim around the entire circumference of the hat). - Call for new or changing lesions.  Lentigines - Scattered tan macules - Due to sun exposure - Benign-appering, observe - Recommend daily broad spectrum sunscreen SPF 30+ to sun-exposed areas, reapply every 2 hours as needed. - Call for any changes  Seborrheic Keratoses - Stuck-on, waxy, tan-brown papules and/or plaques  - Benign-appearing - Discussed benign etiology and prognosis. - Observe - Call for any changes  Return in about 6 months (around 06/17/2022) for AK Follow Up.  I, Emelia Salisbury, CMA, am acting as scribe for Sarina Ser, MD. Documentation: I have reviewed the above documentation for accuracy and completeness, and I agree with the above.  Sarina Ser, MD

## 2021-12-19 ENCOUNTER — Encounter: Payer: Self-pay | Admitting: Dermatology

## 2022-02-14 DIAGNOSIS — K828 Other specified diseases of gallbladder: Secondary | ICD-10-CM | POA: Diagnosis not present

## 2022-02-14 DIAGNOSIS — Z9049 Acquired absence of other specified parts of digestive tract: Secondary | ICD-10-CM | POA: Diagnosis not present

## 2022-02-14 DIAGNOSIS — Z20822 Contact with and (suspected) exposure to covid-19: Secondary | ICD-10-CM | POA: Diagnosis not present

## 2022-02-14 DIAGNOSIS — K8062 Calculus of gallbladder and bile duct with acute cholecystitis without obstruction: Secondary | ICD-10-CM | POA: Diagnosis not present

## 2022-02-14 DIAGNOSIS — Z79899 Other long term (current) drug therapy: Secondary | ICD-10-CM | POA: Diagnosis not present

## 2022-02-14 DIAGNOSIS — R1011 Right upper quadrant pain: Secondary | ICD-10-CM | POA: Diagnosis not present

## 2022-02-15 DIAGNOSIS — R1011 Right upper quadrant pain: Secondary | ICD-10-CM | POA: Diagnosis not present

## 2022-02-15 DIAGNOSIS — K8010 Calculus of gallbladder with chronic cholecystitis without obstruction: Secondary | ICD-10-CM | POA: Diagnosis not present

## 2022-02-15 DIAGNOSIS — K8050 Calculus of bile duct without cholangitis or cholecystitis without obstruction: Secondary | ICD-10-CM | POA: Diagnosis not present

## 2022-02-15 DIAGNOSIS — K8020 Calculus of gallbladder without cholecystitis without obstruction: Secondary | ICD-10-CM | POA: Diagnosis not present

## 2022-02-16 DIAGNOSIS — K8020 Calculus of gallbladder without cholecystitis without obstruction: Secondary | ICD-10-CM | POA: Diagnosis not present

## 2022-03-16 DIAGNOSIS — Z09 Encounter for follow-up examination after completed treatment for conditions other than malignant neoplasm: Secondary | ICD-10-CM | POA: Diagnosis not present

## 2022-06-18 ENCOUNTER — Ambulatory Visit: Payer: Medicare Other | Admitting: Dermatology

## 2022-06-18 DIAGNOSIS — L570 Actinic keratosis: Secondary | ICD-10-CM

## 2022-06-18 DIAGNOSIS — L820 Inflamed seborrheic keratosis: Secondary | ICD-10-CM | POA: Diagnosis not present

## 2022-06-18 DIAGNOSIS — L578 Other skin changes due to chronic exposure to nonionizing radiation: Secondary | ICD-10-CM

## 2022-06-18 DIAGNOSIS — L821 Other seborrheic keratosis: Secondary | ICD-10-CM

## 2022-06-18 NOTE — Progress Notes (Signed)
Follow-Up Visit   Subjective  John Roy is a 66 y.o. male who presents for the following: Actinic Keratosis (6 month ak follow up. Hx of aks at scalp and other locations. Patient has used 46fu cream to affected areas of face and scalp in past. He reports a spot he noticed at right arm he would like checked. ). The patient has spots, moles and lesions to be evaluated, some may be new or changing and the patient has concerns that these could be cancer.  The following portions of the chart were reviewed this encounter and updated as appropriate:  Tobacco  Allergies  Meds  Problems  Med Hx  Surg Hx  Fam Hx     Review of Systems: No other skin or systemic complaints except as noted in HPI or Assessment and Plan.  Objective  Well appearing patient in no apparent distress; mood and affect are within normal limits.  A focused examination was performed including b/l arm, face, scalp ears. Relevant physical exam findings are noted in the Assessment and Plan.  left wrist x 1 Erythematous stuck-on, waxy papule or plaque   Assessment & Plan  Inflamed seborrheic keratosis left wrist x 1  Symptomatic, irritating, patient would like treated.  Destruction of lesion - left wrist x 1 Complexity: simple   Destruction method: cryotherapy   Informed consent: discussed and consent obtained   Timeout:  patient name, date of birth, surgical site, and procedure verified Lesion destroyed using liquid nitrogen: Yes   Region frozen until ice ball extended beyond lesion: Yes   Outcome: patient tolerated procedure well with no complications   Post-procedure details: wound care instructions given   Additional details:  Prior to procedure, discussed risks of blister formation, small wound, skin dyspigmentation, or rare scar following cryotherapy. Recommend Vaseline ointment to treated areas while healing.   Seborrheic Keratoses - Stuck-on, waxy, tan-brown papules and/or plaques  -  Benign-appearing - Discussed benign etiology and prognosis. - Observe - Call for any changes  Actinic Damage - Severe, confluent actinic changes with pre-cancerous actinic keratoses  - Severe, chronic, not at goal, secondary to cumulative UV radiation exposure over time - diffuse scaly erythematous macules and papules with underlying dyspigmentation - Discussed Prescription "Field Treatment" for Severe, Chronic Confluent Actinic Changes with Pre-Cancerous Actinic Keratoses Field treatment involves treatment of an entire area of skin that has confluent Actinic Changes (Sun/ Ultraviolet light damage) and PreCancerous Actinic Keratoses by method of PhotoDynamic Therapy (PDT) and/or prescription Topical Chemotherapy agents such as 5-fluorouracil, 5-fluorouracil/calcipotriene, and/or imiquimod.  The purpose is to decrease the number of clinically evident and subclinical PreCancerous lesions to prevent progression to development of skin cancer by chemically destroying early precancer changes that may or may not be visible.  It has been shown to reduce the risk of developing skin cancer in the treated area. As a result of treatment, redness, scaling, crusting, and open sores may occur during treatment course. One or more than one of these methods may be used and may have to be used several times to control, suppress and eliminate the PreCancerous changes. Discussed treatment course, expected reaction, and possible side effects. - Recommend daily broad spectrum sunscreen SPF 30+ to sun-exposed areas, reapply every 2 hours as needed.  - Staying in the shade or wearing long sleeves, sun glasses (UVA+UVB protection) and wide brim hats (4-inch brim around the entire circumference of the hat) are also recommended. - Call for new or changing lesions.  Restart 5-fluorouracil/calcipotriene cream  twice a day for 7 days to affected areas including top of scalp.   Return for Jan - Feb ak follow up. IRuthell Rummage,  CMA, am acting as scribe for Sarina Ser, MD. Documentation: I have reviewed the above documentation for accuracy and completeness, and I agree with the above.  Sarina Ser, MD

## 2022-06-18 NOTE — Patient Instructions (Addendum)
Restart 5-fluorouracil/calcipotriene cream twice a day for 7 days to affected areas including top of scalp.   5-Fluorouracil/Calcipotriene Patient Education   Actinic keratoses are the dry, red scaly spots on the skin caused by sun damage. A portion of these spots can turn into skin cancer with time, and treating them can help prevent development of skin cancer.   Treatment of these spots requires removal of the defective skin cells. There are various ways to remove actinic keratoses, including freezing with liquid nitrogen, treatment with creams, or treatment with a blue light procedure in the office.   5-fluorouracil cream is a topical cream used to treat actinic keratoses. It works by interfering with the growth of abnormal fast-growing skin cells, such as actinic keratoses. These cells peel off and are replaced by healthy ones.   5-fluorouracil/calcipotriene is a combination of the 5-fluorouracil cream with a vitamin D analog cream called calcipotriene. The calcipotriene alone does not treat actinic keratoses. However, when it is combined with 5-fluorouracil, it helps the 5-fluorouracil treat the actinic keratoses much faster so that the same results can be achieved with a much shorter treatment time.  INSTRUCTIONS FOR 5-FLUOROURACIL/CALCIPOTRIENE CREAM:   5-fluorouracil/calcipotriene cream typically only needs to be used for 4-7 days. A thin layer should be applied twice a day to the treatment areas recommended by your physician.   If your physician prescribed you separate tubes of 5-fluourouracil and calcipotriene, apply a thin layer of 5-fluorouracil followed by a thin layer of calcipotriene.   Avoid contact with your eyes, nostrils, and mouth. Do not use 5-fluorouracil/calcipotriene cream on infected or open wounds.   You will develop redness, irritation and some crusting at areas where you have pre-cancer damage/actinic keratoses. IF YOU DEVELOP PAIN, BLEEDING, OR SIGNIFICANT CRUSTING,  STOP THE TREATMENT EARLY - you have already gotten a good response and the actinic keratoses should clear up well.  Wash your hands after applying 5-fluorouracil 5% cream on your skin.   A moisturizer or sunscreen with a minimum SPF 30 should be applied each morning.   Once you have finished the treatment, you can apply a thin layer of Vaseline twice a day to irritated areas to soothe and calm the areas more quickly. If you experience significant discomfort, contact your physician.  For some patients it is necessary to repeat the treatment for best results.  SIDE EFFECTS: When using 5-fluorouracil/calcipotriene cream, you may have mild irritation, such as redness, dryness, swelling, or a mild burning sensation. This usually resolves within 2 weeks. The more actinic keratoses you have, the more redness and inflammation you can expect during treatment. Eye irritation has been reported rarely. If this occurs, please let us know.  If you have any trouble using this cream, please call the office. If you have any other questions about this information, please do not hesitate to ask me before you leave the office.       Actinic keratoses are precancerous spots that appear secondary to cumulative UV radiation exposure/sun exposure over time. They are chronic with expected duration over 1 year. A portion of actinic keratoses will progress to squamous cell carcinoma of the skin. It is not possible to reliably predict which spots will progress to skin cancer and so treatment is recommended to prevent development of skin cancer.  Recommend daily broad spectrum sunscreen SPF 30+ to sun-exposed areas, reapply every 2 hours as needed.  Recommend staying in the shade or wearing long sleeves, sun glasses (UVA+UVB protection) and wide brim hats (4-inch  brim around the entire circumference of the hat). Call for new or changing lesions.      Seborrheic Keratosis  What causes seborrheic  keratoses? Seborrheic keratoses are harmless, common skin growths that first appear during adult life.  As time goes by, more growths appear.  Some people may develop a large number of them.  Seborrheic keratoses appear on both covered and uncovered body parts.  They are not caused by sunlight.  The tendency to develop seborrheic keratoses can be inherited.  They vary in color from skin-colored to gray, brown, or even black.  They can be either smooth or have a rough, warty surface.   Seborrheic keratoses are superficial and look as if they were stuck on the skin.  Under the microscope this type of keratosis looks like layers upon layers of skin.  That is why at times the top layer may seem to fall off, but the rest of the growth remains and re-grows.    Treatment Seborrheic keratoses do not need to be treated, but can easily be removed in the office.  Seborrheic keratoses often cause symptoms when they rub on clothing or jewelry.  Lesions can be in the way of shaving.  If they become inflamed, they can cause itching, soreness, or burning.  Removal of a seborrheic keratosis can be accomplished by freezing, burning, or surgery. If any spot bleeds, scabs, or grows rapidly, please return to have it checked, as these can be an indication of a skin cancer.  Cryotherapy Aftercare  Wash gently with soap and water everyday.   Apply Vaseline and Band-Aid daily until healed. Cryotherapy Aftercare  Wash gently with soap and water everyday.   Apply Vaseline and Band-Aid daily until healed.       Due to recent changes in healthcare laws, you may see results of your pathology and/or laboratory studies on MyChart before the doctors have had a chance to review them. We understand that in some cases there may be results that are confusing or concerning to you. Please understand that not all results are received at the same time and often the doctors may need to interpret multiple results in order to provide you  with the best plan of care or course of treatment. Therefore, we ask that you please give [place] 2 business days to thoroughly review all your results before contacting the office for clarification. Should we see a critical lab result, you will be contacted sooner.   If You Need Anything After Your Visit  If you have any questions or concerns for your doctor, please call our main line at [phone] and press option 4 to reach your doctor's medical assistant. If no one answers, please leave a voicemail as directed and we will return your call as soon as possible. Messages left after 4 pm will be answered the following business day.   You may also send [place] a message via [place]. We typically respond to MyChart messages within 1-2 business days.  For prescription refills, please ask your pharmacy to contact our office. Our fax number is [phone].  If you have an urgent issue when the clinic is closed that cannot wait until the next business day, you can page your doctor at the number below.    Please note that while we do our best to be available for urgent issues outside of office hours, we are not available 24/7.   If you have an urgent issue and are unable to reach [place], you may choose  to seek medical care at your doctor's office, retail clinic, urgent care center, or emergency room.  If you have a medical emergency, please immediately call 911 or go to the emergency department.  Pager Numbers  - Dr. Nehemiah Massed: [phone]  - Dr. Laurence Ferrari: [phone]  - Dr. Nicole Kindred: [phone]  In the event of inclement weather, please call our main line at [phone] for an update on the status of any delays or closures.  Dermatology Medication Tips: Please keep the boxes that topical medications come in in order to help keep track of the instructions about where and how to use these. Pharmacies typically print the medication instructions only on the boxes and not directly on the medication tubes.    If your medication is too expensive, please contact our office at [phone] option 4 or send [place] a message through [place].   We are unable to tell what your co-pay for medications will be in advance as this is different depending on your insurance coverage. However, we may be able to find a substitute medication at lower cost or fill out paperwork to get insurance to cover a needed medication.   If a prior authorization is required to get your medication covered by your insurance company, please allow [place] 1-2 business days to complete this process.  Drug prices often vary depending on where the prescription is filled and some pharmacies may offer cheaper prices.  The website www.goodrx.com contains coupons for medications through different pharmacies. The prices here do not account for what the cost may be with help from insurance (it may be cheaper with your insurance), but the website can give you the price if you did not use any insurance.  - You can print the associated coupon and take it with your prescription to the pharmacy.  - You may also stop by our office during regular business hours and pick up a GoodRx coupon card.  - If you need your prescription sent electronically to a different pharmacy, notify our office through [facility] or by phone at [phone] option 4.     Si Usted Necesita Algo Despus de Su Visita  Tambin puede enviarnos un mensaje a travs de Pharmacist, community. Por lo general respondemos a los mensajes de MyChart en el transcurso de 1 a 2 das hbiles.  Para renovar recetas, por favor pida a su farmacia que se ponga en contacto con nuestra oficina. Harland Dingwall de fax es [place] [phone].  Si tiene un asunto urgente cuando la clnica est cerrada y que no puede esperar hasta el siguiente da hbil, puede llamar/localizar a su doctor(a) al nmero que aparece a continuacin.   Por favor, tenga en cuenta que aunque hacemos todo lo posible para estar  disponibles para asuntos urgentes fuera del horario de [place], no estamos disponibles las 24 horas del da, los 7 das de la [place].   Si tiene un problema urgente y no puede comunicarse con nosotros, puede optar por buscar atencin mdica  en el consultorio de su doctor(a), en una clnica privada, en un centro de atencin urgente o en una sala de emergencias.  Si tiene Engineering geologist, por favor llame inmediatamente al 911 o vaya a la sala de emergencias.  Nmeros de bper  - Dr. Nehemiah Massed: [phone]  - Dra. Moye: [phone]  - Dra. Nicole Kindred: [phone]  En caso de inclemencias del [place], por favor llame a Johnsie Kindred principal al [phone] para una actualizacin sobre el [place] de cualquier retraso o cierre.  Consejos  para Conservation officer, nature en dermatologa: Por favor, guarde las cajas en las que vienen los medicamentos de uso tpico para ayudarle a seguir las instrucciones sobre dnde y cmo usarlos. Las farmacias generalmente imprimen las instrucciones del medicamento slo en las cajas y no directamente en los tubos del [place].   Si su medicamento es muy caro, por favor, pngase en contacto con Zigmund Daniel llamando al [phone] y presione la opcin 4 o envenos un mensaje a travs de Pharmacist, community.   No podemos decirle cul ser su copago por los medicamentos por adelantado ya que esto es diferente dependiendo de la cobertura de su seguro. Sin embargo, es posible que podamos encontrar un medicamento sustituto a Electrical engineer un formulario para que el seguro cubra el medicamento que se considera necesario.   Si se requiere una autorizacin previa para que su compaa de seguros [place] su medicamento, por favor permtanos de 1 a 2 das hbiles para completar este proceso.  Los precios de los medicamentos varan con frecuencia dependiendo del Environmental consultant de dnde se surte la receta y alguna farmacias pueden ofrecer precios ms baratos.  El sitio web www.goodrx.com tiene  cupones para medicamentos de Airline pilot. Los precios aqu no tienen en cuenta lo que podra costar con la ayuda del seguro (puede ser ms barato con su seguro), pero el sitio web puede darle el precio si no utiliz Research scientist (physical sciences).  - Puede imprimir el cupn correspondiente y llevarlo con su receta a la farmacia.  - Tambin puede pasar por nuestra oficina durante el horario de atencin regular y Charity fundraiser una tarjeta de cupones de GoodRx.  - Si necesita que su receta se enve electrnicamente a una farmacia diferente, informe a nuestra oficina a travs de MyChart de [place] o por telfono llamando al [phone] y presione la opcin 4.

## 2022-06-26 ENCOUNTER — Encounter: Payer: Self-pay | Admitting: Dermatology

## 2022-08-09 ENCOUNTER — Encounter: Payer: Self-pay | Admitting: Family Medicine

## 2022-08-09 ENCOUNTER — Ambulatory Visit (INDEPENDENT_AMBULATORY_CARE_PROVIDER_SITE_OTHER): Payer: Medicare Other | Admitting: Family Medicine

## 2022-08-09 VITALS — BP 110/58 | HR 50 | Resp 16 | Ht 70.5 in | Wt 181.0 lb

## 2022-08-09 DIAGNOSIS — Z23 Encounter for immunization: Secondary | ICD-10-CM

## 2022-08-09 DIAGNOSIS — Z0000 Encounter for general adult medical examination without abnormal findings: Secondary | ICD-10-CM

## 2022-08-09 NOTE — Progress Notes (Signed)
Medicare Initial Preventative Physical Exam  I,[name],acting as a scribe for [name], MD.,have documented all relevant documentation on the behalf of [name], MD,as directed by  [name], MD while in the presence of [name], MD.   Patient: John Roy, Male    DOB: 22-May-1956, 66 y.o.   MRN: [id number] Visit Date: 08/09/2022  Today's Provider: [name], MD   Subjective:    Chief Complaint  Patient presents with   [facility]    Medicare Initial Preventative Physical Exam John Roy is a 66 y.o. male who presents today for his Initial Preventative Physical Exam.  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  Wife is now retired also and they have twins that are both doing well and they have  twin grandchildren, a boy and a girl. HPI  Social History   Socioeconomic History   Marital status: Married    Spouse name: Not on file   Number of children: Not on file   Years of education: Not on file   Highest education level: Not on file  Occupational History   Not on file  Tobacco Use   Smoking status: Never   Smokeless tobacco: Never  Substance and Sexual Activity   Alcohol use: Yes    Comment: ocassional/rare   Drug use: No   Sexual activity: Not on file  Other Topics Concern   Not on file  Social History Narrative   Not on file   Social Determinants of Health   Financial Resource Strain: Not on file  Food Insecurity: Not on file  Transportation Needs: Not on file  Physical Activity: Not on file  Stress: Not on file  Social Connections: Not on file  Intimate Partner Violence: Not on file    History reviewed. No pertinent past medical history.   Patient Active Problem List   Diagnosis Date Noted   Biliary colic 00/86/7619   Cholelithiasis with acute cholecystitis without biliary obstruction 11/22/2015   Calculus of gallbladder 11/22/2015   Acid reflux 11/22/2015   Chronic  tension type headache 11/22/2015   Osteoarthrosis 11/22/2015    Past Surgical History:  Procedure Laterality Date   ANKLE SURGERY      His family history includes Diabetes in his maternal grandfather, sister, and sister; Heart disease in his mother; Kidney failure in his paternal grandfather; Lung cancer in his father.   Current Outpatient Medications:    Aspirin-Acetaminophen-Caffeine (EXCEDRIN PO), Take by mouth., Disp: , Rfl:    Ferrous Sulfate (IRON PO), daily., Disp: , Rfl:    Patient Care Team: Jerrol Banana., MD as PCP - General (Family Medicine)  Review of Systems      Objective:    Vitals: BP (!) 110/58 (BP Location: Right Arm, Patient Position: Sitting, Cuff Size: Large)   Pulse (!) 50   Resp 16   Ht 5' 10.5" (1.791 m)   Wt 181 lb (82.1 kg)   SpO2 94%   BMI 25.60 kg/m  Vision Screening   Right eye Left eye Both eyes  Without correction     With correction '20/20 20/20 20/20 '$   Physical Exam Vitals reviewed.  Constitutional:      Appearance: He is well-developed.  HENT:     Head: Normocephalic and atraumatic.     Right Ear: External ear normal.     Left Ear: External ear normal.     Nose: Nose normal.  Eyes:  General: No scleral icterus.    Conjunctiva/sclera: Conjunctivae normal.  Neck:     Thyroid: No thyromegaly.  Cardiovascular:     Rate and Rhythm: Normal rate and regular rhythm.     Heart sounds: Normal heart sounds.  Pulmonary:     Effort: Pulmonary effort is normal.     Breath sounds: Normal breath sounds.  Abdominal:     Palpations: Abdomen is soft.  Genitourinary:    Penis: Normal.      Testes: Normal.  Skin:    General: Skin is warm and dry.     Comments: AKs.  Neurological:     General: No focal deficit present.     Mental Status: He is alert and oriented to person, place, and time.  Psychiatric:        Mood and Affect: Mood normal.        Behavior: Behavior normal.        Thought Content: Thought content normal.         Judgment: Judgment normal.      Activities of Daily Living    08/09/2022    9:30 AM  In your present state of health, do you have any difficulty performing the following activities:  Hearing? 0  Vision? 0  Difficulty concentrating or making decisions? 0  Walking or climbing stairs? 0  Dressing or bathing? 0  Doing errands, shopping? 0    Fall Risk Assessment    08/09/2022    9:30 AM 08/07/2021   10:42 AM 01/28/2018    9:17 AM 11/23/2015    9:42 AM  Fall Risk   Falls in the past year? 0 0 Yes No  Number falls in past yr: 0 0 1   Comment   fell off of his roof cleaning the gutters out about 4 months ago   Injury with Fall? 0 0 Yes   Risk for fall due to : No Fall Risks No Fall Risks    Follow up Falls evaluation completed Falls evaluation completed       Depression Screen    08/09/2022    9:30 AM 08/07/2021   10:42 AM 08/03/2020    9:46 AM 01/28/2018    9:17 AM  PHQ 2/9 Scores  PHQ - 2 Score 0 0 0 0  PHQ- 9 Score 0  1         No data to display            Assessment & Plan:    Initial Preventative Physical Exam  Reviewed patient's Family Medical History Reviewed and updated list of patient's medical providers Assessment of cognitive impairment was done Assessed patient's functional ability Established a written schedule for health screening services Health Risk Assessent Completed and Reviewed  Exercise Activities and Dietary recommendations  Goals   None     Immunization History  Administered Date(s) Administered   Influenza,inj,Quad PF,6+ Mos 11/23/2015, 11/27/2016   Influenza-Unspecified 09/23/2021   Td 02/17/2004   Tdap 03/22/2011, 08/07/2021   Zoster, Live 11/15/2014    Health Maintenance  Topic Date Due   COVID-19 Vaccine (1) Never done   Zoster Vaccines- Shingrix (1 of 2) Never done   Pneumonia Vaccine 76+ Years old (1 - PCV) Never done   INFLUENZA VACCINE  07/10/2022   COLONOSCOPY (Pts 45-79yr Insurance coverage will need to be  confirmed)  03/27/2028   TETANUS/TDAP  08/08/2031   Hepatitis C Screening  Completed   HIV Screening  Completed   HPV VACCINES  Aged  Out     Discussed health benefits of physical activity, and encouraged him to engage in regular exercise appropriate for his age and condition.   1. Welcome to Medicare preventive visit  - EKG 12-Lead  2. Need for pneumococcal vaccine  - Pneumococcal conjugate vaccine 20-valent (Prevnar 20)    Julie Paolini Cranford Mon, MD  [facility] [phone] (phone) [phone] (fax)  [place]

## 2022-08-14 NOTE — Progress Notes (Unsigned)
      Established patient visit   Patient: John Roy   DOB: 1956/06/16   66 y.o. Male  MRN: [id number] Visit Date: 08/15/2022  Today's healthcare provider: [name], MD   No chief complaint on file.  Subjective    HPI  Patient is here for follow up labs. Patient had Welcome to Medicare visit 08/08/2022.  Medications: Outpatient Medications Prior to Visit  Medication Sig   Aspirin-Acetaminophen-Caffeine (EXCEDRIN PO) Take by mouth.   Ferrous Sulfate (IRON PO) daily.   No facility-administered medications prior to visit.    Review of Systems  Constitutional:  Negative for appetite change, chills and fever.  Respiratory:  Negative for chest tightness, shortness of breath and wheezing.   Cardiovascular:  Negative for chest pain and palpitations.  Gastrointestinal:  Negative for abdominal pain, nausea and vomiting.    {Labs  Heme  Chem  Endocrine  Serology  Results Review (optional):23779}   Objective    There were no vitals taken for this visit. {Show previous vital signs (optional):23777}  Physical Exam  ***  No results found for any visits on 08/15/22.  Assessment & Plan     ***  No follow-ups on file.      {provider attestation***:1}   [name], MD  [facility] [phone] (phone) [phone] (fax)  [place]

## 2022-08-15 ENCOUNTER — Ambulatory Visit (INDEPENDENT_AMBULATORY_CARE_PROVIDER_SITE_OTHER): Payer: Medicare Other | Admitting: Family Medicine

## 2022-08-15 DIAGNOSIS — Z136 Encounter for screening for cardiovascular disorders: Secondary | ICD-10-CM

## 2022-08-15 DIAGNOSIS — Z1329 Encounter for screening for other suspected endocrine disorder: Secondary | ICD-10-CM

## 2022-08-15 DIAGNOSIS — K219 Gastro-esophageal reflux disease without esophagitis: Secondary | ICD-10-CM

## 2022-08-15 DIAGNOSIS — Z13228 Encounter for screening for other metabolic disorders: Secondary | ICD-10-CM

## 2022-08-15 DIAGNOSIS — Z125 Encounter for screening for malignant neoplasm of prostate: Secondary | ICD-10-CM

## 2022-08-15 DIAGNOSIS — M1990 Unspecified osteoarthritis, unspecified site: Secondary | ICD-10-CM

## 2022-08-15 DIAGNOSIS — Z130 Encounter for screening for diseases of the blood and blood-forming organs and certain disorders involving the immune mechanism: Secondary | ICD-10-CM | POA: Diagnosis not present

## 2022-08-15 NOTE — Progress Notes (Signed)
MyChart Video Visit    Virtual Visit via Video Note   This visit type was conducted due to national recommendations for restrictions regarding the COVID-19 Pandemic (e.g. social distancing) in an effort to limit this patient's exposure and mitigate transmission in our community. This patient is at least at moderate risk for complications without adequate follow up. This format is felt to be most appropriate for this patient at this time. Physical exam was limited by quality of the video and audio technology used for the visit.   Patient location: Home Provider location: Office  I discussed the limitations of evaluation and management by telemedicine and the availability of in person appointments. The patient expressed understanding and agreed to proceed.  Patient: John Roy   DOB: [birth year]   66 y.o. Male  MRN: [id number] Visit Date: 08/15/2022  Today's healthcare provider: [name], MD   No chief complaint on file.  Subjective    HPI  Patient had welcome to Medicare visit last week and needs follow-up for his history of anemia and hypercholesterolemia.  He had a cholecystectomy earlier this year.   Medications: Outpatient Medications Prior to Visit  Medication Sig   Aspirin-Acetaminophen-Caffeine (EXCEDRIN PO) Take by mouth.   Ferrous Sulfate (IRON PO) daily.   No facility-administered medications prior to visit.    Review of Systems  Last lipids Lab Results  Component Value Date   CHOL 186 08/07/2021   HDL 51 08/07/2021   LDLCALC 122 (H) 08/07/2021   TRIG 69 08/07/2021   CHOLHDL 3.6 08/07/2021       Objective    There were no vitals taken for this visit.  BP Readings from Last 3 Encounters:  08/09/22 (!) 110/58  08/07/21 131/78  08/03/20 124/77   Wt Readings from Last 3 Encounters:  08/09/22 181 lb (82.1 kg)  08/07/21 184 lb (83.5 kg)  08/03/20 179 lb (81.2 kg)       Physical Exam     Assessment & Plan     1. Screening for  metabolic disorder  - Lipid panel - TSH - CBC w/Diff/Platelet - Comprehensive Metabolic Panel (CMET)  2. Screening for thyroid disorder  - Lipid panel - TSH - CBC w/Diff/Platelet - Comprehensive Metabolic Panel (CMET)  3. Encounter for screening for hematologic disorder  - Lipid panel - TSH - CBC w/Diff/Platelet - Comprehensive Metabolic Panel (CMET)  4. Gastroesophageal reflux disease without esophagitis  - Lipid panel - TSH - CBC w/Diff/Platelet - Comprehensive Metabolic Panel (CMET)  5. Encounter for special screening examination for cardiovascular disorder  - Lipid panel - TSH - CBC w/Diff/Platelet - Comprehensive Metabolic Panel (CMET)  6. Osteoarthritis, unspecified osteoarthritis type, unspecified site  - Lipid panel - TSH - CBC w/Diff/Platelet - Comprehensive Metabolic Panel (CMET)  7. Prostate cancer screening  - PSA   No follow-ups on file.     I discussed the assessment and treatment plan with the patient. The patient was provided an opportunity to ask questions and all were answered. The patient agreed with the plan and demonstrated an understanding of the instructions.   The patient was advised to call back or seek an in-person evaluation if the symptoms worsen or if the condition fails to improve as anticipated.  I provided 5 minutes of non-face-to-face time during this encounter.  I, [name], MD, have reviewed all documentation for this visit. The documentation on 08/15/22 for the exam, diagnosis, procedures, and orders are all accurate and complete.  Tiffny Gemmer Cranford Mon, MD [facility] [phone] (phone) [phone] (fax)  [place]

## 2022-08-16 LAB — LIPID PANEL
Chol/HDL Ratio: 3.7 ratio (ref 0.0–5.0)
Cholesterol, Total: 170 mg/dL (ref 100–199)
HDL: 46 mg/dL (ref >39)
LDL Chol Calc (NIH): 114 mg/dL — ABNORMAL HIGH (ref 0–99)
Triglycerides: 50 mg/dL (ref 0–149)
VLDL Cholesterol Cal: 10 mg/dL (ref 5–40)

## 2022-08-16 LAB — CBC WITH DIFFERENTIAL/PLATELET
Basophils Absolute: 0 10*3/uL (ref 0.0–0.2)
Basos: 1 %
EOS (ABSOLUTE): 0.1 10*3/uL (ref 0.0–0.4)
Eos: 3 %
Hematocrit: 35.7 % — ABNORMAL LOW (ref 37.5–51.0)
Hemoglobin: 11.1 g/dL — ABNORMAL LOW (ref 13.0–17.7)
Immature Grans (Abs): 0 10*3/uL (ref 0.0–0.1)
Immature Granulocytes: 0 %
Lymphocytes Absolute: 1.3 10*3/uL (ref 0.7–3.1)
Lymphs: 40 %
MCH: 23.9 pg — ABNORMAL LOW (ref 26.6–33.0)
MCHC: 31.1 g/dL — ABNORMAL LOW (ref 31.5–35.7)
MCV: 77 fL — ABNORMAL LOW (ref 79–97)
Monocytes Absolute: 0.4 10*3/uL (ref 0.1–0.9)
Monocytes: 10 %
Neutrophils Absolute: 1.5 10*3/uL (ref 1.4–7.0)
Neutrophils: 46 %
Platelets: 241 10*3/uL (ref 150–450)
RBC: 4.65 x10E6/uL (ref 4.14–5.80)
RDW: 15.7 % — ABNORMAL HIGH (ref 11.6–15.4)
WBC: 3.4 10*3/uL (ref 3.4–10.8)

## 2022-08-16 LAB — COMPREHENSIVE METABOLIC PANEL
ALT: 14 IU/L (ref 0–44)
AST: 16 IU/L (ref 0–40)
Albumin/Globulin Ratio: 2 (ref 1.2–2.2)
Albumin: 4.4 g/dL (ref 3.9–4.9)
Alkaline Phosphatase: 69 IU/L (ref 44–121)
BUN/Creatinine Ratio: 14 (ref 10–24)
BUN: 14 mg/dL (ref 8–27)
Bilirubin Total: 0.4 mg/dL (ref 0.0–1.2)
CO2: 24 mmol/L (ref 20–29)
Calcium: 9.1 mg/dL (ref 8.6–10.2)
Chloride: 104 mmol/L (ref 96–106)
Creatinine, Ser: 1 mg/dL (ref 0.76–1.27)
Globulin, Total: 2.2 g/dL (ref 1.5–4.5)
Glucose: 107 mg/dL — ABNORMAL HIGH (ref 70–99)
Potassium: 4.7 mmol/L (ref 3.5–5.2)
Sodium: 141 mmol/L (ref 134–144)
Total Protein: 6.6 g/dL (ref 6.0–8.5)
eGFR: 84 mL/min/{1.73_m2} (ref >59)

## 2022-08-16 LAB — TSH: TSH: 2.68 u[IU]/mL (ref 0.450–4.500)

## 2022-08-16 LAB — PSA: Prostate Specific Ag, Serum: 1.8 ng/mL (ref 0.0–4.0)

## 2022-08-31 ENCOUNTER — Telehealth: Payer: Self-pay | Admitting: Family Medicine

## 2022-08-31 DIAGNOSIS — R7989 Other specified abnormal findings of blood chemistry: Secondary | ICD-10-CM

## 2022-08-31 NOTE — Telephone Encounter (Signed)
Left detailed message for patient to stop by lab for iron panel. Patient advised no appt is needed. Lab is open Mon-Fri 8-11:30 am and 1-4:30 pm.

## 2022-08-31 NOTE — Telephone Encounter (Signed)
Patient called in for lab results. Results given. Patients wants to know if he needs to come back in for a iron panel mentioned in the lab note by Dr. Rosanna Randy, please advise.

## 2022-10-29 ENCOUNTER — Telehealth: Payer: Medicare Other | Admitting: Physician Assistant

## 2022-10-29 DIAGNOSIS — J208 Acute bronchitis due to other specified organisms: Secondary | ICD-10-CM | POA: Diagnosis not present

## 2022-10-29 DIAGNOSIS — B9689 Other specified bacterial agents as the cause of diseases classified elsewhere: Secondary | ICD-10-CM | POA: Diagnosis not present

## 2022-10-29 MED ORDER — DOXYCYCLINE HYCLATE 100 MG PO TABS: 100.0000 mg | ORAL_TABLET | Freq: Two times a day (BID) | ORAL | 0 refills | Status: AC

## 2022-10-29 MED ORDER — BENZONATATE 100 MG PO CAPS: 100.0000 mg | ORAL_CAPSULE | Freq: Three times a day (TID) | ORAL | 0 refills | Status: AC | PRN

## 2022-10-29 MED ORDER — ALBUTEROL SULFATE HFA 108 (90 BASE) MCG/ACT IN AERS: 1.0000 | INHALATION_SPRAY | Freq: Four times a day (QID) | RESPIRATORY_TRACT | 0 refills | Status: AC | PRN

## 2022-10-29 NOTE — Progress Notes (Signed)
We are sorry that you are not feeling well.  Here is how we plan to help!  Based on your presentation I believe you most likely have A cough due to bacteria.  When patients have a fever and a productive cough with a change in color or increased sputum production, we are concerned about bacterial bronchitis.  If left untreated it can progress to pneumonia.  If your symptoms do not improve with your treatment plan it is important that you contact your provider.   I have prescribed Doxycycline 100 mg twice a day for 7 days     In addition you may use A prescription cough medication called Tessalon Perles '100mg'$ . You may take 1-2 capsules every 8 hours as needed for your cough.  Albuterol inhaler Use 1-2 puffs every 6 hours as needed for wheezing and shortness of breath.  From your responses in the eVisit questionnaire you describe inflammation in the upper respiratory tract which is causing a significant cough.  This is commonly called Bronchitis and has four common causes:   Allergies Viral Infections Acid Reflux Bacterial Infection Allergies, viruses and acid reflux are treated by controlling symptoms or eliminating the cause. An example might be a cough caused by taking certain blood pressure medications. You stop the cough by changing the medication. Another example might be a cough caused by acid reflux. Controlling the reflux helps control the cough.  USE OF BRONCHODILATOR ("RESCUE") INHALERS: There is a risk from using your bronchodilator too frequently.  The risk is that over-reliance on a medication which only relaxes the muscles surrounding the breathing tubes can reduce the effectiveness of medications prescribed to reduce swelling and congestion of the tubes themselves.  Although you feel brief relief from the bronchodilator inhaler, your asthma may actually be worsening with the tubes becoming more swollen and filled with mucus.  This can delay other crucial treatments, such as oral steroid  medications. If you need to use a bronchodilator inhaler daily, several times per day, you should discuss this with your provider.  There are probably better treatments that could be used to keep your asthma under control.     HOME CARE Only take medications as instructed by your medical team. Complete the entire course of an antibiotic. Drink plenty of fluids and get plenty of rest. Avoid close contacts especially the very young and the elderly Cover your mouth if you cough or cough into your sleeve. Always remember to wash your hands A steam or ultrasonic humidifier can help congestion.   GET HELP RIGHT AWAY IF: You develop worsening fever. You become short of breath You cough up blood. Your symptoms persist after you have completed your treatment plan MAKE SURE YOU  Understand these instructions. Will watch your condition. Will get help right away if you are not doing well or get worse.    Thank you for choosing an e-visit.  Your e-visit answers were reviewed by a board certified advanced clinical practitioner to complete your personal care plan. Depending upon the condition, your plan could have included both over the counter or prescription medications.  Please review your pharmacy choice. Make sure the pharmacy is open so you can pick up prescription now. If there is a problem, you may contact your provider through [employer] and have the prescription routed to another pharmacy.  Your safety is important to [place]. If you have drug allergies check your prescription carefully.   For the next 24 hours you can use MyChart to ask  questions about today's visit, request a non-urgent call back, or ask for a work or school excuse. You will get an email in the next two days asking about your experience. I hope that your e-visit has been valuable and will speed your recovery.  I have spent 5 minutes in review of e-visit questionnaire, review and updating patient chart, medical decision  making and response to patient.   Mar Daring, PA-C

## 2023-01-03 ENCOUNTER — Ambulatory Visit: Payer: Medicare Other | Admitting: Dermatology

## 2023-01-03 VITALS — BP 127/73 | HR 64

## 2023-01-03 DIAGNOSIS — Z5111 Encounter for antineoplastic chemotherapy: Secondary | ICD-10-CM

## 2023-01-03 DIAGNOSIS — Z79899 Other long term (current) drug therapy: Secondary | ICD-10-CM | POA: Diagnosis not present

## 2023-01-03 DIAGNOSIS — L578 Other skin changes due to chronic exposure to nonionizing radiation: Secondary | ICD-10-CM | POA: Diagnosis not present

## 2023-01-03 DIAGNOSIS — L821 Other seborrheic keratosis: Secondary | ICD-10-CM

## 2023-01-03 DIAGNOSIS — L570 Actinic keratosis: Secondary | ICD-10-CM

## 2023-01-03 DIAGNOSIS — L814 Other melanin hyperpigmentation: Secondary | ICD-10-CM

## 2023-01-03 NOTE — Progress Notes (Signed)
Follow-Up Visit   Subjective  John Roy is a 67 y.o. male who presents for the following: Actinic Keratosis (Patient here for ak follow up. Patient prescribed field treatment cream to use at scalp but did not start cream. ). The patient has spots, moles and lesions to be evaluated, some may be new or changing and the patient has concerns that these could be cancer.  The following portions of the chart were reviewed this encounter and updated as appropriate:  Tobacco  Allergies  Meds  Problems  Med Hx  Surg Hx  Fam Hx     Review of Systems: No other skin or systemic complaints except as noted in HPI or Assessment and Plan.  Objective  Well appearing patient in no apparent distress; mood and affect are within normal limits.  A focused examination was performed including face and scalp. Relevant physical exam findings are noted in the Assessment and Plan.  right cheek x 1,left cheek x 1 (2) Erythematous thin papules/macules with gritty scale.    Assessment & Plan  Actinic keratosis (2) right cheek x 1,left cheek x 1 Recommend starting cream   5-fluorouracil/calcipotriene cream twice a day for 7 days to affected areas including scalp. Prescription sent to [facility].   Reviewed course of treatment and expected reaction.  Patient advised to expect inflammation and crusting and advised that erosions are possible.  Patient advised to be diligent with sun protection during and after treatment. Counseled to keep medication out of reach of children and pets.  Actinic keratoses are precancerous spots that appear secondary to cumulative UV radiation exposure/sun exposure over time. They are chronic with expected duration over 1 year. A portion of actinic keratoses will progress to squamous cell carcinoma of the skin. It is not possible to reliably predict which spots will progress to skin cancer and so treatment is recommended to prevent development of skin  cancer.  Recommend daily broad spectrum sunscreen SPF 30+ to sun-exposed areas, reapply every 2 hours as needed.  Recommend staying in the shade or wearing long sleeves, sun glasses (UVA+UVB protection) and wide brim hats (4-inch brim around the entire circumference of the hat). Call for new or changing lesions.  Destruction of lesion - right cheek x 1,left cheek x 1 Complexity: simple   Destruction method: cryotherapy   Informed consent: discussed and consent obtained   Timeout:  patient name, date of birth, surgical site, and procedure verified Lesion destroyed using liquid nitrogen: Yes   Region frozen until ice ball extended beyond lesion: Yes   Outcome: patient tolerated procedure well with no complications   Post-procedure details: wound care instructions given   Additional details:  Prior to procedure, discussed risks of blister formation, small wound, skin dyspigmentation, or rare scar following cryotherapy. Recommend Vaseline ointment to treated areas while healing.  Actinic Damage with PreCancerous Actinic Keratoses Counseling for Topical Chemotherapy Management: Patient exhibits: - Severe, confluent actinic changes with pre-cancerous actinic keratoses that is secondary to cumulative UV radiation exposure over time - Condition that is severe; chronic, not at goal. - diffuse scaly erythematous macules and papules with underlying dyspigmentation - Discussed Prescription "Field Treatment" topical Chemotherapy for Severe, Chronic Confluent Actinic Changes with Pre-Cancerous Actinic Keratoses Field treatment involves treatment of an entire area of skin that has confluent Actinic Changes (Sun/ Ultraviolet light damage) and PreCancerous Actinic Keratoses by method of PhotoDynamic Therapy (PDT) and/or prescription Topical Chemotherapy agents such as 5-fluorouracil, 5-fluorouracil/calcipotriene, and/or imiquimod.  The purpose is  to decrease the number of clinically evident and subclinical  PreCancerous lesions to prevent progression to development of skin cancer by chemically destroying early precancer changes that may or may not be visible.  It has been shown to reduce the risk of developing skin cancer in the treated area. As a result of treatment, redness, scaling, crusting, and open sores may occur during treatment course. One or more than one of these methods may be used and may have to be used several times to control, suppress and eliminate the PreCancerous changes. Discussed treatment course, expected reaction, and possible side effects. - Recommend daily broad spectrum sunscreen SPF 30+ to sun-exposed areas, reapply every 2 hours as needed.  - Staying in the shade or wearing long sleeves, sun glasses (UVA+UVB protection) and wide brim hats (4-inch brim around the entire circumference of the hat) are also recommended. - Call for new or changing lesions.  Recommend starting  5-fluorouracil/calcipotriene cream twice a day for 7 days to affected areas including scalp.   Reviewed course of treatment and expected reaction.  Patient advised to expect inflammation and crusting and advised that erosions are possible.  Patient advised to be diligent with sun protection during and after treatment. Counseled to keep medication out of reach of children and pets.  Seborrheic Keratoses - Stuck-on, waxy, tan-brown papules and/or plaques  - Benign-appearing - Discussed benign etiology and prognosis. - Observe - Call for any changes  Actinic Damage - chronic, secondary to cumulative UV radiation exposure/sun exposure over time - diffuse scaly erythematous macules with underlying dyspigmentation - Recommend daily broad spectrum sunscreen SPF 30+ to sun-exposed areas, reapply every 2 hours as needed.  - Recommend staying in the shade or wearing long sleeves, sun glasses (UVA+UVB protection) and wide brim hats (4-inch brim around the entire circumference of the hat). - Call for new or  changing lesions.  Lentigines - Scattered tan macules - Due to sun exposure - Benign-appearing, observe - Recommend daily broad spectrum sunscreen SPF 30+ to sun-exposed areas, reapply every 2 hours as needed. - Call for any changes  Return in about 6 months (around 07/04/2023) for isk and ak followup.  IRuthell Rummage, CMA, am acting as scribe for Sarina Ser, MD. Documentation: I have reviewed the above documentation for accuracy and completeness, and I agree with the above.  Sarina Ser, MD

## 2023-01-03 NOTE — Patient Instructions (Addendum)
- Start 5-fluorouracil/calcipotriene cream twice a day for 7 days to affected areas including scalp.  Reviewed course of treatment and expected reaction.  Patient advised to expect inflammation and crusting and advised that erosions are possible.  Patient advised to be diligent with sun protection during and after treatment. Counseled to keep medication out of reach of children and pets.     5-Fluorouracil/Calcipotriene Patient Education   Actinic keratoses are the dry, red scaly spots on the skin caused by sun damage. A portion of these spots can turn into skin cancer with time, and treating them can help prevent development of skin cancer.   Treatment of these spots requires removal of the defective skin cells. There are various ways to remove actinic keratoses, including freezing with liquid nitrogen, treatment with creams, or treatment with a blue light procedure in the office.   5-fluorouracil cream is a topical cream used to treat actinic keratoses. It works by interfering with the growth of abnormal fast-growing skin cells, such as actinic keratoses. These cells peel off and are replaced by healthy ones.   5-fluorouracil/calcipotriene is a combination of the 5-fluorouracil cream with a vitamin D analog cream called calcipotriene. The calcipotriene alone does not treat actinic keratoses. However, when it is combined with 5-fluorouracil, it helps the 5-fluorouracil treat the actinic keratoses much faster so that the same results can be achieved with a much shorter treatment time.  INSTRUCTIONS FOR 5-FLUOROURACIL/CALCIPOTRIENE CREAM:   5-fluorouracil/calcipotriene cream typically only needs to be used for 4-7 days. A thin layer should be applied twice a day to the treatment areas recommended by your physician.   If your physician prescribed you separate tubes of 5-fluourouracil and calcipotriene, apply a thin layer of 5-fluorouracil followed by a thin layer of calcipotriene.   Avoid  contact with your eyes, nostrils, and mouth. Do not use 5-fluorouracil/calcipotriene cream on infected or open wounds.   You will develop redness, irritation and some crusting at areas where you have pre-cancer damage/actinic keratoses. IF YOU DEVELOP PAIN, BLEEDING, OR SIGNIFICANT CRUSTING, STOP THE TREATMENT EARLY - you have already gotten a good response and the actinic keratoses should clear up well.  Wash your hands after applying 5-fluorouracil 5% cream on your skin.   A moisturizer or sunscreen with a minimum SPF 30 should be applied each morning.   Once you have finished the treatment, you can apply a thin layer of Vaseline twice a day to irritated areas to soothe and calm the areas more quickly. If you experience significant discomfort, contact your physician.  For some patients it is necessary to repeat the treatment for best results.  SIDE EFFECTS: When using 5-fluorouracil/calcipotriene cream, you may have mild irritation, such as redness, dryness, swelling, or a mild burning sensation. This usually resolves within 2 weeks. The more actinic keratoses you have, the more redness and inflammation you can expect during treatment. Eye irritation has been reported rarely. If this occurs, please let us know.  If you have any trouble using this cream, please call the office. If you have any other questions about this information, please do not hesitate to ask me before you leave the office.     Actinic keratoses are precancerous spots that appear secondary to cumulative UV radiation exposure/sun exposure over time. They are chronic with expected duration over 1 year. A portion of actinic keratoses will progress to squamous cell carcinoma of the skin. It is not possible to reliably predict which spots will progress to skin cancer  and so treatment is recommended to prevent development of skin cancer.  Recommend daily broad spectrum sunscreen SPF 30+ to sun-exposed areas, reapply every 2 hours  as needed.  Recommend staying in the shade or wearing long sleeves, sun glasses (UVA+UVB protection) and wide brim hats (4-inch brim around the entire circumference of the hat). Call for new or changing lesions.   Cryotherapy Aftercare  Wash gently with soap and water everyday.   Apply Vaseline and Band-Aid daily until healed.       Due to recent changes in healthcare laws, you may see results of your pathology and/or laboratory studies on MyChart before the doctors have had a chance to review them. We understand that in some cases there may be results that are confusing or concerning to you. Please understand that not all results are received at the same time and often the doctors may need to interpret multiple results in order to provide you with the best plan of care or course of treatment. Therefore, we ask that you please give [place] 2 business days to thoroughly review all your results before contacting the office for clarification. Should we see a critical lab result, you will be contacted sooner.   If You Need Anything After Your Visit  If you have any questions or concerns for your doctor, please call our main line at [phone] and press option 4 to reach your doctor's medical assistant. If no one answers, please leave a voicemail as directed and we will return your call as soon as possible. Messages left after 4 pm will be answered the following business day.   You may also send [place] a message via [place]. We typically respond to MyChart messages within 1-2 business days.  For prescription refills, please ask your pharmacy to contact our office. Our fax number is [phone].  If you have an urgent issue when the clinic is closed that cannot wait until the next business day, you can page your doctor at the number below.    Please note that while we do our best to be available for urgent issues outside of office hours, we are not available 24/7.   If you have an urgent issue and  are unable to reach [place], you may choose to seek medical care at your doctor's office, retail clinic, urgent care center, or emergency room.  If you have a medical emergency, please immediately call 911 or go to the emergency department.  Pager Numbers  - Dr. Nehemiah Massed: [phone]  - Dr. Laurence Ferrari: [phone]  - Dr. Nicole Kindred: [phone]  In the event of inclement weather, please call our main line at [phone] for an update on the status of any delays or closures.  Dermatology Medication Tips: Please keep the boxes that topical medications come in in order to help keep track of the instructions about where and how to use these. Pharmacies typically print the medication instructions only on the boxes and not directly on the medication tubes.   If your medication is too expensive, please contact our office at [phone] option 4 or send [place] a message through [place].   We are unable to tell what your co-pay for medications will be in advance as this is different depending on your insurance coverage. However, we may be able to find a substitute medication at lower cost or fill out paperwork to get insurance to cover a needed medication.   If a prior authorization is required to get your medication covered by your insurance company, please  allow [place] 1-2 business days to complete this process.  Drug prices often vary depending on where the prescription is filled and some pharmacies may offer cheaper prices.  The website www.goodrx.com contains coupons for medications through different pharmacies. The prices here do not account for what the cost may be with help from insurance (it may be cheaper with your insurance), but the website can give you the price if you did not use any insurance.  - You can print the associated coupon and take it with your prescription to the pharmacy.  - You may also stop by our office during regular business hours and pick up a GoodRx coupon card.  - If you need your  prescription sent electronically to a different pharmacy, notify our office through [facility] or by phone at [phone] option 4.     Si Usted Necesita Algo Despus de Su Visita  Tambin puede enviarnos un mensaje a travs de Pharmacist, community. Por lo general respondemos a los mensajes de MyChart en el transcurso de 1 a 2 das hbiles.  Para renovar recetas, por favor pida a su farmacia que se ponga en contacto con nuestra oficina. Harland Dingwall de fax es [place] [phone].  Si tiene un asunto urgente cuando la clnica est cerrada y que no puede esperar hasta el siguiente da hbil, puede llamar/localizar a su doctor(a) al nmero que aparece a continuacin.   Por favor, tenga en cuenta que aunque hacemos todo lo posible para estar disponibles para asuntos urgentes fuera del horario de [place], no estamos disponibles las 24 horas del da, los 7 das de la [place].   Si tiene un problema urgente y no puede comunicarse con nosotros, puede optar por buscar atencin mdica  en el consultorio de su doctor(a), en una clnica privada, en un centro de atencin urgente o en una sala de emergencias.  Si tiene Engineering geologist, por favor llame inmediatamente al 911 o vaya a la sala de emergencias.  Nmeros de bper  - Dr. Nehemiah Massed: [phone]  - Dra. Moye: [phone]  - Dra. Nicole Kindred: [phone]  En caso de inclemencias del [place], por favor llame a Johnsie Kindred principal al [phone] para una actualizacin sobre el [place] de cualquier retraso o cierre.  Consejos para la medicacin en dermatologa: Por favor, guarde las cajas en las que vienen los medicamentos de uso tpico para ayudarle a seguir las instrucciones sobre dnde y cmo usarlos. Las farmacias generalmente imprimen las instrucciones del medicamento slo en las cajas y no directamente en los tubos del [place].   Si su medicamento es muy caro, por favor, pngase en contacto con Zigmund Daniel llamando al [phone]  y presione la opcin 4 o envenos un mensaje a travs de Pharmacist, community.   No podemos decirle cul ser su copago por los medicamentos por adelantado ya que esto es diferente dependiendo de la cobertura de su seguro. Sin embargo, es posible que podamos encontrar un medicamento sustituto a Electrical engineer un formulario para que el seguro cubra el medicamento que se considera necesario.   Si se requiere una autorizacin previa para que su compaa de seguros [place] su medicamento, por favor permtanos de 1 a 2 das hbiles para completar este proceso.  Los precios de los medicamentos varan con frecuencia dependiendo del Environmental consultant de dnde se surte la receta y alguna farmacias pueden ofrecer precios ms baratos.  El sitio web www.goodrx.com tiene cupones para medicamentos de Airline pilot. Los precios aqu no tienen en cuenta lo que  podra costar con la ayuda del seguro (puede ser ms barato con su seguro), pero el sitio web puede darle el precio si no Field seismologist.  - Puede imprimir el cupn correspondiente y llevarlo con su receta a la farmacia.  - Tambin puede pasar por nuestra oficina durante el horario de atencin regular y Charity fundraiser una tarjeta de cupones de GoodRx.  - Si necesita que su receta se enve electrnicamente a una farmacia diferente, informe a nuestra oficina a travs de MyChart de [place] o por telfono llamando al [phone] y presione la opcin 4.

## 2023-01-11 ENCOUNTER — Encounter: Payer: Self-pay | Admitting: Dermatology

## 2023-02-20 NOTE — Telephone Encounter (Signed)
Made in error

## 2023-06-27 ENCOUNTER — Ambulatory Visit: Payer: Medicare Other | Admitting: Dermatology

## 2023-07-29 ENCOUNTER — Ambulatory Visit: Payer: Medicare Other | Admitting: Dermatology

## 2023-07-29 VITALS — BP 140/90

## 2023-07-29 DIAGNOSIS — L578 Other skin changes due to chronic exposure to nonionizing radiation: Secondary | ICD-10-CM | POA: Diagnosis not present

## 2023-07-29 DIAGNOSIS — L570 Actinic keratosis: Secondary | ICD-10-CM

## 2023-07-29 DIAGNOSIS — D692 Other nonthrombocytopenic purpura: Secondary | ICD-10-CM | POA: Diagnosis not present

## 2023-07-29 DIAGNOSIS — Z79899 Other long term (current) drug therapy: Secondary | ICD-10-CM

## 2023-07-29 DIAGNOSIS — Z5111 Encounter for antineoplastic chemotherapy: Secondary | ICD-10-CM

## 2023-07-29 DIAGNOSIS — Z7189 Other specified counseling: Secondary | ICD-10-CM

## 2023-07-29 DIAGNOSIS — W908XXA Exposure to other nonionizing radiation, initial encounter: Secondary | ICD-10-CM

## 2023-07-29 NOTE — Patient Instructions (Addendum)
Cryotherapy Aftercare  Wash gently with soap and water everyday.   Apply Vaseline and Band-Aid daily until healed.   Instructions for [facility] Medications  One or more of your medications was sent to the [facility] mail order compounding pharmacy. You will receive an email from them and can purchase the medicine through that link. It will then be mailed to your home at the address you confirmed. If for any reason you do not receive an email from them, please check your spam folder. If you still do not find the email, please let us know. [facility] phone number is [phone].     Due to recent changes in healthcare laws, you may see results of your pathology and/or laboratory studies on MyChart before the doctors have had a chance to review them. We understand that in some cases there may be results that are confusing or concerning to you. Please understand that not all results are received at the same time and often the doctors may need to interpret multiple results in order to provide you with the best plan of care or course of treatment. Therefore, we ask that you please give [place] 2 business days to thoroughly review all your results before contacting the office for clarification. Should we see a critical lab result, you will be contacted sooner.   If You Need Anything After Your Visit  If you have any questions or concerns for your doctor, please call our main line at [phone] and press option 4 to reach your doctor's medical assistant. If no one answers, please leave a voicemail as directed and we will return your call as soon as possible. Messages left after 4 pm will be answered the following business day.   You may also send [place] a message via MyChart. We typically respond to MyChart messages within 1-2 business days.  For prescription refills, please ask your pharmacy to contact our office. Our fax number is [phone].  If you have an urgent issue when the clinic  is closed that cannot wait until the next business day, you can page your doctor at the number below.    Please note that while we do our best to be available for urgent issues outside of office hours, we are not available 24/7.   If you have an urgent issue and are unable to reach [place], you may choose to seek medical care at your doctor's office, retail clinic, urgent care center, or emergency room.  If you have a medical emergency, please immediately call 911 or go to the emergency department.  Pager Numbers  - Dr. Gwen Pounds: [phone]  - Dr. Roseanne Reno: [phone]  - Dr. Katrinka Blazing: [phone]   In the event of inclement weather, please call our main line at [phone] for an update on the status of any delays or closures.  Dermatology Medication Tips: Please keep the boxes that topical medications come in in order to help keep track of the instructions about where and how to use these. Pharmacies typically print the medication instructions only on the boxes and not directly on the medication tubes.   If your medication is too expensive, please contact our office at [phone] option 4 or send [place] a message through MyChart.   We are unable to tell what your co-pay for medications will be in advance as this is different depending on your insurance coverage. However, we may be able to find a substitute medication at lower cost or fill out paperwork to get insurance to  cover a needed medication.   If a prior authorization is required to get your medication covered by your insurance company, please allow [place] 1-2 business days to complete this process.  Drug prices often vary depending on where the prescription is filled and some pharmacies may offer cheaper prices.  The website www.goodrx.com contains coupons for medications through different pharmacies. The prices here do not account for what the cost may be with help from insurance (it may be cheaper with your insurance), but the website  can give you the price if you did not use any insurance.  - You can print the associated coupon and take it with your prescription to the pharmacy.  - You may also stop by our office during regular business hours and pick up a GoodRx coupon card.  - If you need your prescription sent electronically to a different pharmacy, notify our office through [facility] or by phone at [phone] option 4.     Si Usted Necesita Algo Despus de Su Visita  Tambin puede enviarnos un mensaje a travs de Clinical cytogeneticist. Por lo general respondemos a los mensajes de MyChart en el transcurso de 1 a 2 das hbiles.  Para renovar recetas, por favor pida a su farmacia que se ponga en contacto con nuestra oficina. Annie Sable de fax es [place] [phone].  Si tiene un asunto urgente cuando la clnica est cerrada y que no puede esperar hasta el siguiente da hbil, puede llamar/localizar a su doctor(a) al nmero que aparece a continuacin.   Por favor, tenga en cuenta que aunque hacemos todo lo posible para estar disponibles para asuntos urgentes fuera del horario de [place], no estamos disponibles las 24 horas del da, los 7 [address]  [address] de la [place].   Si tiene un problema urgente y no puede comunicarse con nosotros, puede optar por buscar atencin mdica  en el consultorio de su doctor(a), en una clnica privada, en un centro de atencin urgente o en una sala de emergencias.  Si tiene Engineer, drilling, por favor llame inmediatamente al 911 o vaya a la sala de emergencias.  Nmeros de bper  - Dr. Gwen Pounds: [phone]  - Dra. Roseanne Reno: [phone]  - Dr. Katrinka Blazing: [phone]   En caso de inclemencias del tiempo, por favor llame a Lacy Duverney principal al [phone] para una actualizacin sobre el [place] de cualquier retraso o cierre.  Consejos para la medicacin en dermatologa: Por favor, guarde las cajas en las que vienen los medicamentos de uso tpico para ayudarle a seguir las instrucciones  sobre dnde y cmo usarlos. Las farmacias generalmente imprimen las instrucciones del medicamento slo en las cajas y no directamente en los tubos del [place].   Si su medicamento es muy caro, por favor, pngase en contacto con Rolm Gala llamando al [phone] y presione la opcin 4 o envenos un mensaje a travs de Clinical cytogeneticist.   No podemos decirle cul ser su copago por los medicamentos por adelantado ya que esto es diferente dependiendo de la cobertura de su seguro. Sin embargo, es posible que podamos encontrar un medicamento sustituto a Audiological scientist un formulario para que el seguro cubra el medicamento que se considera necesario.   Si se requiere una autorizacin previa para que su compaa de seguros [place] su medicamento, por favor permtanos de 1 a 2 das hbiles para completar [address]  Los precios de los medicamentos varan con frecuencia dependiendo del Environmental consultant de dnde se surte la receta y alguna farmacias pueden ofrecer precios  ms baratos.  El sitio web www.goodrx.com tiene cupones para medicamentos de Health and safety inspector. Los precios aqu no tienen en cuenta lo que podra costar con la ayuda del seguro (puede ser ms barato con su seguro), pero el sitio web puede darle el precio si no utiliz Tourist information centre manager.  - Puede imprimir el cupn correspondiente y llevarlo con su receta a la farmacia.  - Tambin puede pasar por nuestra oficina durante el horario de atencin regular y Education officer, museum una tarjeta de cupones de GoodRx.  - Si necesita que su receta se enve electrnicamente a una farmacia diferente, informe a nuestra oficina a travs de MyChart de [place] o por telfono llamando al [phone] y presione la opcin 4.

## 2023-07-29 NOTE — Progress Notes (Signed)
Follow-Up Visit   Subjective  John Roy is a 67 y.o. male who presents for the following: AK f/u 38m , scalp, ears, nose and spot treated face has used ~3 courses of bid for ~7 days The patient has spots, moles and lesions to be evaluated, some may be new or changing and the patient may have concern these could be cancer.   The following portions of the chart were reviewed this encounter and updated as appropriate: medications, allergies, medical history  Review of Systems:  No other skin or systemic complaints except as noted in HPI or Assessment and Plan.  Objective  Well appearing patient in no apparent distress; mood and affect are within normal limits.   A focused examination was performed of the following areas: Face, scalp, ears  Relevant exam findings are noted in the Assessment and Plan.  scalp, face, ears, x 9 (9) Pink scaly macules    Assessment & Plan     AK (actinic keratosis) (9) scalp, face, ears, x 9  Actinic keratoses are precancerous spots that appear secondary to cumulative UV radiation exposure/sun exposure over time. They are chronic with expected duration over 1 year. A portion of actinic keratoses will progress to squamous cell carcinoma of the skin. It is not possible to reliably predict which spots will progress to skin cancer and so treatment is recommended to prevent development of skin cancer.  Recommend daily broad spectrum sunscreen SPF 30+ to sun-exposed areas, reapply every 2 hours as needed.  Recommend staying in the shade or wearing long sleeves, sun glasses (UVA+UVB protection) and wide brim hats (4-inch brim around the entire circumference of the hat). Call for new or changing lesions.  Destruction of lesion - scalp, face, ears, x 9 (9) Complexity: simple   Destruction method: cryotherapy   Informed consent: discussed and consent obtained   Timeout:  patient name, date of birth, surgical site, and procedure verified Lesion  destroyed using liquid nitrogen: Yes   Region frozen until ice ball extended beyond lesion: Yes   Outcome: patient tolerated procedure well with no complications   Post-procedure details: wound care instructions given     ACTINIC DAMAGE WITH PRECANCEROUS ACTINIC KERATOSES Counseling for Topical Chemotherapy Management: Patient exhibits: - Severe, confluent actinic changes with pre-cancerous actinic keratoses that is secondary to cumulative UV radiation exposure over time - Condition that is severe; chronic, not at goal. - diffuse scaly erythematous macules and papules with underlying dyspigmentation - Discussed Prescription "Field Treatment" topical Chemotherapy for Severe, Chronic Confluent Actinic Changes with Pre-Cancerous Actinic Keratoses Field treatment involves treatment of an entire area of skin that has confluent Actinic Changes (Sun/ Ultraviolet light damage) and PreCancerous Actinic Keratoses by method of PhotoDynamic Therapy (PDT) and/or prescription Topical Chemotherapy agents such as 5-fluorouracil, 5-fluorouracil/calcipotriene, and/or imiquimod.  The purpose is to decrease the number of clinically evident and subclinical PreCancerous lesions to prevent progression to development of skin cancer by chemically destroying early precancer changes that may or may not be visible.  It has been shown to reduce the risk of developing skin cancer in the treated area. As a result of treatment, redness, scaling, crusting, and open sores may occur during treatment course. One or more than one of these methods may be used and may have to be used several times to control, suppress and eliminate the PreCancerous changes. Discussed treatment course, expected reaction, and possible side effects. - Recommend daily broad spectrum sunscreen SPF 30+ to sun-exposed areas, reapply every 2 hours  as needed.  - Staying in the shade or wearing long sleeves, sun glasses (UVA+UVB protection) and wide brim hats  (4-inch brim around the entire circumference of the hat) are also recommended. - Call for new or changing lesions.  - Cont 5-fluorouracil cream twice a day for total of 7 days to affected areas including R neck, L neck recheck on f/u.  Reviewed course of treatment and expected reaction.  Patient advised to expect inflammation and crusting and advised that erosions are possible.  Patient advised to be diligent with sun protection during and after treatment. Handout with details of how to apply medication and what to expect provided. Counseled to keep medication out of reach of children and pets.   Purpura - Chronic; persistent and recurrent.  Treatable, but not curable. - Violaceous macules and patches - Benign - Related to trauma, age, sun damage and/or use of blood thinners, chronic use of topical and/or oral steroids - Observe - Can use OTC arnica containing moisturizer such as Dermend Bruise Formula if desired - Call for worsening or other concerns   Return in about 6 months (around 01/29/2024) for AK f/u, recheck R and L neck txted with 5FU/Calcipotriene cr.  I, Ardis Rowan, RMA, am acting as scribe for Armida Sans, MD .   Documentation: I have reviewed the above documentation for accuracy and completeness, and I agree with the above.  Armida Sans, MD

## 2023-08-05 ENCOUNTER — Encounter: Payer: Self-pay | Admitting: Dermatology

## 2024-01-30 ENCOUNTER — Ambulatory Visit: Payer: Medicare Other | Admitting: Dermatology
